# Patient Record
Sex: Female | Born: 1970 | Race: White | Hispanic: No | Smoking: Never smoker
Health system: Southern US, Community
[De-identification: ages and names within clinical notes are randomized; demographics above are authoritative.]

## PROBLEM LIST (undated history)

## (undated) DIAGNOSIS — K219 Gastro-esophageal reflux disease without esophagitis: Secondary | ICD-10-CM

## (undated) DIAGNOSIS — N200 Calculus of kidney: Secondary | ICD-10-CM

## (undated) DIAGNOSIS — N1330 Unspecified hydronephrosis: Secondary | ICD-10-CM

## (undated) HISTORY — PX: HERNIA REPAIR: SHX51

---

## 2014-04-21 ENCOUNTER — Encounter (HOSPITAL_COMMUNITY): Payer: Self-pay | Admitting: Emergency Medicine

## 2014-04-21 ENCOUNTER — Emergency Department (HOSPITAL_COMMUNITY)
Admission: EM | Admit: 2014-04-21 | Discharge: 2014-04-22 | Disposition: A | Discharge status: Home / Self Care | Payer: BC Managed Care – PPO | Attending: Emergency Medicine | Admitting: Emergency Medicine

## 2014-04-21 DIAGNOSIS — R109 Unspecified abdominal pain: Secondary | ICD-10-CM | POA: Insufficient documentation

## 2014-04-21 DIAGNOSIS — N200 Calculus of kidney: Secondary | ICD-10-CM | POA: Insufficient documentation

## 2014-04-21 DIAGNOSIS — Z3202 Encounter for pregnancy test, result negative: Secondary | ICD-10-CM | POA: Insufficient documentation

## 2014-04-21 DIAGNOSIS — K219 Gastro-esophageal reflux disease without esophagitis: Secondary | ICD-10-CM | POA: Insufficient documentation

## 2014-04-21 DIAGNOSIS — Z9889 Other specified postprocedural states: Secondary | ICD-10-CM | POA: Insufficient documentation

## 2014-04-21 DIAGNOSIS — N12 Tubulo-interstitial nephritis, not specified as acute or chronic: Secondary | ICD-10-CM | POA: Insufficient documentation

## 2014-04-21 DIAGNOSIS — Z79899 Other long term (current) drug therapy: Secondary | ICD-10-CM | POA: Insufficient documentation

## 2014-04-21 HISTORY — DX: Unspecified hydronephrosis: N13.30

## 2014-04-21 HISTORY — DX: Gastro-esophageal reflux disease without esophagitis: K21.9

## 2014-04-21 HISTORY — DX: Calculus of kidney: N20.0

## 2014-04-21 NOTE — ED Notes (Addendum)
Pt c/o L flank pain onset 2 hours ago, pt states she did have fever yesterday with dysuria. Pt states she began AZO yesterday. Pt states she has chronic hydronephrosis and kidney stones

## 2014-04-22 ENCOUNTER — Emergency Department (HOSPITAL_COMMUNITY): Payer: BC Managed Care – PPO

## 2014-04-22 LAB — BASIC METABOLIC PANEL
Anion gap: 12 (ref 5–15)
BUN: 21 mg/dL (ref 6–23)
CHLORIDE: 100 meq/L (ref 96–112)
CO2: 26 mEq/L (ref 19–32)
Calcium: 9.2 mg/dL (ref 8.4–10.5)
Creatinine, Ser: 1.18 mg/dL — ABNORMAL HIGH (ref 0.50–1.10)
GFR calc non Af Amer: 56 mL/min — ABNORMAL LOW (ref >90)
GFR, EST AFRICAN AMERICAN: 65 mL/min — AB (ref >90)
Glucose, Bld: 97 mg/dL (ref 70–99)
POTASSIUM: 3.9 meq/L (ref 3.7–5.3)
Sodium: 138 mEq/L (ref 137–147)

## 2014-04-22 LAB — CBC WITH DIFFERENTIAL/PLATELET
BASOS PCT: 0 % (ref 0–1)
Basophils Absolute: 0 10*3/uL (ref 0.0–0.1)
Eosinophils Absolute: 0.2 10*3/uL (ref 0.0–0.7)
Eosinophils Relative: 2 % (ref 0–5)
HCT: 38.3 % (ref 36.0–46.0)
HEMOGLOBIN: 13.2 g/dL (ref 12.0–15.0)
LYMPHS ABS: 3.1 10*3/uL (ref 0.7–4.0)
Lymphocytes Relative: 30 % (ref 12–46)
MCH: 31.7 pg (ref 26.0–34.0)
MCHC: 34.5 g/dL (ref 30.0–36.0)
MCV: 92.1 fL (ref 78.0–100.0)
Monocytes Absolute: 0.8 10*3/uL (ref 0.1–1.0)
Monocytes Relative: 8 % (ref 3–12)
NEUTROS ABS: 6 10*3/uL (ref 1.7–7.7)
NEUTROS PCT: 60 % (ref 43–77)
Platelets: 214 10*3/uL (ref 150–400)
RBC: 4.16 MIL/uL (ref 3.87–5.11)
RDW: 12.3 % (ref 11.5–15.5)
WBC: 10.1 10*3/uL (ref 4.0–10.5)

## 2014-04-22 LAB — URINALYSIS, ROUTINE W REFLEX MICROSCOPIC
Bilirubin Urine: NEGATIVE
Glucose, UA: NEGATIVE mg/dL
Ketones, ur: NEGATIVE mg/dL
Nitrite: POSITIVE — AB
Protein, ur: 30 mg/dL — AB
SPECIFIC GRAVITY, URINE: 1.012 (ref 1.005–1.030)
UROBILINOGEN UA: 1 mg/dL (ref 0.0–1.0)
pH: 6 (ref 5.0–8.0)

## 2014-04-22 LAB — URINE MICROSCOPIC-ADD ON

## 2014-04-22 LAB — POC URINE PREG, ED: PREG TEST UR: NEGATIVE

## 2014-04-22 MED ORDER — HYDROCODONE-ACETAMINOPHEN 5-325 MG PO TABS: 1.0000 | ORAL_TABLET | Freq: Four times a day (QID) | ORAL | Status: AC | PRN

## 2014-04-22 MED ORDER — DEXTROSE 5 % IV SOLN
1.0000 g | Freq: Once | INTRAVENOUS | Status: AC
  Administered 2014-04-22: 1 g via INTRAVENOUS
  Filled 2014-04-22: qty 10

## 2014-04-22 MED ORDER — CEPHALEXIN 500 MG PO CAPS: 500.0000 mg | ORAL_CAPSULE | Freq: Two times a day (BID) | ORAL | Status: DC

## 2014-04-22 MED ORDER — SODIUM CHLORIDE 0.9 % IV BOLUS (SEPSIS)
1000.0000 mL | Freq: Once | INTRAVENOUS | Status: AC
  Administered 2014-04-22: 1000 mL via INTRAVENOUS

## 2014-04-22 MED ORDER — KETOROLAC TROMETHAMINE 30 MG/ML IJ SOLN
30.0000 mg | Freq: Once | INTRAMUSCULAR | Status: AC
  Administered 2014-04-22: 30 mg via INTRAVENOUS
  Filled 2014-04-22: qty 1

## 2014-04-22 MED ORDER — ONDANSETRON 8 MG PO TBDP: 8.0000 mg | ORAL_TABLET | Freq: Three times a day (TID) | ORAL | Status: DC | PRN

## 2014-04-22 NOTE — Discharge Instructions (Signed)
You have a kidney infection. We also see a kidney stone on the left side with some hydronephrosis. Hard to tell if the kidney stone is causing any problems. Please see the Urologist as requested. Please return to the ER if your symptoms worsen; you have increased pain, fevers, chills, inability to keep any medications down, confusion. Otherwise see the outpatient doctor as requested.   Kidney Stones Kidney stones (urolithiasis) are deposits that form inside your kidneys. The intense pain is caused by the stone moving through the urinary tract. When the stone moves, the ureter goes into spasm around the stone. The stone is usually passed in the urine.  CAUSES   A disorder that makes certain neck glands produce too much parathyroid hormone (primary hyperparathyroidism).  A buildup of uric acid crystals, similar to gout in your joints.  Narrowing (stricture) of the ureter.  A kidney obstruction present at birth (congenital obstruction).  Previous surgery on the kidney or ureters.  Numerous kidney infections. SYMPTOMS   Feeling sick to your stomach (nauseous).  Throwing up (vomiting).  Blood in the urine (hematuria).  Pain that usually spreads (radiates) to the groin.  Frequency or urgency of urination. DIAGNOSIS   Taking a history and physical exam.  Blood or urine tests.  CT scan.  Occasionally, an examination of the inside of the urinary bladder (cystoscopy) is performed. TREATMENT   Observation.  Increasing your fluid intake.  Extracorporeal shock wave lithotripsy--This is a noninvasive procedure that uses shock waves to break up kidney stones.  Surgery may be needed if you have severe pain or persistent obstruction. There are various surgical procedures. Most of the procedures are performed with the use of small instruments. Only small incisions are needed to accommodate these instruments, so recovery time is minimized. The size, location, and chemical composition  are all important variables that will determine the proper choice of action for you. Talk to your health care provider to better understand your situation so that you will minimize the risk of injury to yourself and your kidney.  HOME CARE INSTRUCTIONS   Drink enough water and fluids to keep your urine clear or pale yellow. This will help you to pass the stone or stone fragments.  Strain all urine through the provided strainer. Keep all particulate matter and stones for your health care provider to see. The stone causing the pain may be as small as a grain of salt. It is very important to use the strainer each and every time you pass your urine. The collection of your stone will allow your health care provider to analyze it and verify that a stone has actually passed. The stone analysis will often identify what you can do to reduce the incidence of recurrences.  Only take over-the-counter or prescription medicines for pain, discomfort, or fever as directed by your health care provider.  Make a follow-up appointment with your health care provider as directed.  Get follow-up X-rays if required. The absence of pain does not always mean that the stone has passed. It may have only stopped moving. If the urine remains completely obstructed, it can cause loss of kidney function or even complete destruction of the kidney. It is your responsibility to make sure X-rays and follow-ups are completed. Ultrasounds of the kidney can show blockages and the status of the kidney. Ultrasounds are not associated with any radiation and can be performed easily in a matter of minutes. SEEK MEDICAL CARE IF:  You experience pain that is progressive and  unresponsive to any pain medicine you have been prescribed. SEEK IMMEDIATE MEDICAL CARE IF:   Pain cannot be controlled with the prescribed medicine.  You have a fever or shaking chills.  The severity or intensity of pain increases over 18 hours and is not relieved by  pain medicine.  You develop a new onset of abdominal pain.  You feel faint or pass out.  You are unable to urinate. MAKE SURE YOU:   Understand these instructions.  Will watch your condition.  Will get help right away if you are not doing well or get worse. Document Released: 09/14/2005 Document Revised: 05/17/2013 Document Reviewed: 02/15/2013 [facility] Patient Information 2015 [place], [place]. This information is not intended to replace advice given to you by your health care provider. Make sure you discuss any questions you have with your health care provider.  Pyelonephritis, Adult Pyelonephritis is a kidney infection. In general, there are 2 main types of pyelonephritis:  Infections that come on quickly without any warning (acute pyelonephritis).  Infections that persist for a long period of time (chronic pyelonephritis). CAUSES  Two main causes of pyelonephritis are:  Bacteria traveling from the bladder to the kidney. This is a problem especially in pregnant women. The urine in the bladder can become filled with bacteria from multiple causes, including:  Inflammation of the prostate gland (prostatitis).  Sexual intercourse in females.  Bladder infection (cystitis).  Bacteria traveling from the bloodstream to the tissue part of the kidney. Problems that may increase your risk of getting a kidney infection include:  Diabetes.  Kidney stones or bladder stones.  Cancer.  Catheters placed in the bladder.  Other abnormalities of the kidney or ureter. SYMPTOMS   Abdominal pain.  Pain in the side or flank area.  Fever.  Chills.  Upset stomach.  Blood in the urine (dark urine).  Frequent urination.  Strong or persistent urge to urinate.  Burning or stinging when urinating. DIAGNOSIS  Your caregiver may diagnose your kidney infection based on your symptoms. A urine sample may also be taken. TREATMENT  In general, treatment depends on how severe the  infection is.   If the infection is mild and caught early, your caregiver may treat you with oral antibiotics and send you home.  If the infection is more severe, the bacteria may have gotten into the bloodstream. This will require intravenous (IV) antibiotics and a hospital stay. Symptoms may include:  High fever.  Severe flank pain.  Shaking chills.  Even after a hospital stay, your caregiver may require you to be on oral antibiotics for a period of time.  Other treatments may be required depending upon the cause of the infection. HOME CARE INSTRUCTIONS   Take your antibiotics as directed. Finish them even if you start to feel better.  Make an appointment to have your urine checked to make sure the infection is gone.  Drink enough fluids to keep your urine clear or pale yellow.  Take medicines for the bladder if you have urgency and frequency of urination as directed by your caregiver. SEEK IMMEDIATE MEDICAL CARE IF:   You have a fever or persistent symptoms for more than 2-3 days.  You have a fever and your symptoms suddenly get worse.  You are unable to take your antibiotics or fluids.  You develop shaking chills.  You experience extreme weakness or fainting.  There is no improvement after 2 days of treatment. MAKE SURE YOU:  Understand these instructions.  Will watch your condition.  Will get help right away if you are not doing well or get worse. Document Released: 09/14/2005 Document Revised: 03/15/2012 Document Reviewed: 02/18/2011 [facility] Patient Information 2015 [place], [place]. This information is not intended to replace advice given to you by your health care provider. Make sure you discuss any questions you have with your health care provider.

## 2014-04-22 NOTE — ED Provider Notes (Signed)
CSN: [id number]     Arrival date & time 04/21/14  2241 History   First MD Initiated Contact with Patient 04/21/14 2308     Chief Complaint  Patient presents with  . Flank Pain     (Consider location/radiation/quality/duration/timing/severity/associated sxs/prior Treatment) HPI Comments: A 43 yo female with a PMH of kidney stones and hydronephrosis presents to the ED with complaints of left flank pain x 1 day.  She describes the pain as a DULL,  constant pain deep in her left side.  5/10 in intensity.  Denies radiation of the pain.  States she had a fever yesterday afternoon.  Today she began to have dysuria.  Denies urinary frequency or gross blood in the urine.  Patient began using OTC urinary tract infection treatment this afternoon.  No changes to bowel movements.  Patient states LMP was 04/15/14.  Denies nausea, vomiting, diarrhea.  Dr. Maxie Better from [place] is the pt's PCP.  Pt states she had routine imaging of her kidneys 7-8 months ago and everything appeared normal at this time.  Prior abdominal surgeries include an umbilical hernia repair 2 years ago as well as a Cesarean section 14 years ago.          Patient is a 43 y.o. female presenting with flank pain. The history is provided by the patient.  Flank Pain Pertinent negatives include no chest pain, no abdominal pain, no headaches and no shortness of breath.    Past Medical History  Diagnosis Date  . Kidney stones   . Hydronephrosis   . GERD (gastroesophageal reflux disease)    Past Surgical History  Procedure Laterality Date  . Hernia repair    . Cesarean section     No family history on file. History  Substance Use Topics  . Smoking status: Never Smoker   . Smokeless tobacco: Not on file  . Alcohol Use: Yes     Comment: rare   OB History   Grav Para Term Preterm Abortions TAB SAB Ect Mult Living                 Review of Systems  Constitutional: Positive for fever and activity change.  Respiratory: Negative for  shortness of breath.   Cardiovascular: Negative for chest pain.  Gastrointestinal: Positive for nausea. Negative for vomiting and abdominal pain.  Genitourinary: Positive for flank pain. Negative for dysuria.  Musculoskeletal: Negative for neck pain.  Neurological: Negative for headaches.      Allergies  Review of patient's allergies indicates no known allergies.  Home Medications   Prior to Admission medications   Medication Sig Start Date End Date Taking? Authorizing Provider  loratadine (CLARITIN) 10 MG tablet Take 10 mg by mouth at bedtime as needed for allergies.   Yes Historical Provider, MD  naphazoline-pheniramine (NAPHCON-A) 0.025-0.3 % ophthalmic solution Place 2 drops into both eyes 4 (four) times daily as needed for irritation.   Yes Historical Provider, MD  omeprazole (PRILOSEC) 20 MG capsule Take 1 capsule by mouth at bedtime as needed (acid reflux).  02/11/14  Yes Historical Provider, MD  cephALEXin (KEFLEX) 500 MG capsule Take 1 capsule (500 mg total) by mouth 2 (two) times daily. 04/22/14   Varney Biles, MD  HYDROcodone-acetaminophen (NORCO/VICODIN) 5-325 MG per tablet Take 1 tablet by mouth every 6 (six) hours as needed. 04/22/14   Varney Biles, MD  ondansetron (ZOFRAN ODT) 8 MG disintegrating tablet Take 1 tablet (8 mg total) by mouth every 8 (eight) hours as needed for nausea.  04/22/14   Athan Casalino Kathrynn Humble, MD   BP 129/81  Pulse 65  Temp(Src) 98.1 F (36.7 C) (Oral)  Resp 18  Wt 150 lb (68.04 kg)  SpO2 96%  LMP 04/12/2014 Physical Exam  Nursing note and vitals reviewed. Constitutional: She is oriented to person, place, and time. She appears well-developed and well-nourished.  HENT:  Head: Normocephalic and atraumatic.  Eyes: EOM are normal. Pupils are equal, round, and reactive to light.  Neck: Neck supple.  Cardiovascular: Normal rate, regular rhythm and normal heart sounds.   No murmur heard. Pulmonary/Chest: Effort normal. No respiratory distress.   Abdominal: Soft. She exhibits no distension. There is no tenderness. There is no rebound and no guarding.  Neurological: She is alert and oriented to person, place, and time.  Skin: Skin is warm and dry.    ED Course  Procedures (including critical care time) Labs Review Labs Reviewed  BASIC METABOLIC PANEL - Abnormal; Notable for the following:    Creatinine, Ser 1.18 (*)    GFR calc non Af Amer 56 (*)    GFR calc Af Amer 65 (*)    All other components within normal limits  URINALYSIS, ROUTINE W REFLEX MICROSCOPIC - Abnormal; Notable for the following:    Color, Urine ORANGE (*)    Hgb urine dipstick MODERATE (*)    Protein, ur 30 (*)    Nitrite POSITIVE (*)    Leukocytes, UA SMALL (*)    All other components within normal limits  URINE MICROSCOPIC-ADD ON - Abnormal; Notable for the following:    Bacteria, UA MANY (*)    All other components within normal limits  URINE CULTURE  CBC WITH DIFFERENTIAL  POC URINE PREG, ED    Imaging Review US Renal  04/22/2014   CLINICAL DATA:  Left flank pain.  EXAM: RENAL/URINARY TRACT ULTRASOUND COMPLETE  COMPARISON:  None.  FINDINGS: Right Kidney:  Length: 11.0 cm. Echogenicity within normal limits. No mass or hydronephrosis visualized.  Left Kidney:  Length: 11.6 cm. Echogenicity within normal limits. Moderate hydronephrosis present. 8 mm echogenic focus within knee left renal pelvis/proximal left ureter is suggestive of a stone. 1.5 x 1.4 x 1.6 cm cystic lesion with possible internal debris present within the upper pole of the left kidney. There is possible associated mural calcification. No internal vascularity. No other focal lesions.  Bladder:  Appears normal for degree of bladder distention.  IMPRESSION: 1. Moderate left hydronephrosis with 8 mm calculus within the left renal pelvis/proximal left ureter. 2. 1.5 x 1.4 x 1.6 cm complex cystic lesion within the upper pole of the left kidney with internal debris. Finding is indeterminate.  Comparison with prior studies to assess the stability of this lesion is recommended. 3. Normal ultrasound of the right kidney.   Electronically Signed   By: Jeannine Boga M.D.   On: 04/22/2014 02:07     EKG Interpretation None      MDM   Final diagnoses:  Pyelonephritis  Nephrolithiasis    Pt comes in with cc of abd pain. Pt has hx of hydronephrosis. The UA shows infection and she has dysuria. Pain is constant, dull - atypical of renal stones, but the [place] does appear to show stone. Pt 's pain is tolerable, she is not septic and she is tolerating po well - so we will d/c her with keflex and Urology f/u for the hydronephrosis and possible stone. Return precautions have been discussed.  Varney Biles, MD 04/23/14 [phone]

## 2014-04-24 LAB — URINE CULTURE
Colony Count: >=100000
Special Requests: NORMAL

## 2014-04-27 ENCOUNTER — Telehealth (HOSPITAL_BASED_OUTPATIENT_CLINIC_OR_DEPARTMENT_OTHER): Payer: Self-pay | Admitting: Emergency Medicine

## 2014-04-27 NOTE — Telephone Encounter (Signed)
Post ED Visit - Positive Culture Follow-up  Culture report reviewed by antimicrobial stewardship pharmacist: []  Wes [place], Pharm.D., BCPS [x]  Heide Guile, Pharm.D., BCPS []  Alycia Rossetti, Pharm.D., BCPS []  [place], Pharm.D., BCPS, AAHIVP []  Legrand Como, Pharm.D., BCPS, AAHIVP  Positive urine culture Treated with Keflex, organism sensitive to the same and no further patient follow-up is required at this time.  Byars, Rex Kras 04/27/2014, 11:08 AM

## 2014-06-27 ENCOUNTER — Emergency Department (HOSPITAL_BASED_OUTPATIENT_CLINIC_OR_DEPARTMENT_OTHER): Payer: BC Managed Care – PPO

## 2014-06-27 ENCOUNTER — Encounter (HOSPITAL_BASED_OUTPATIENT_CLINIC_OR_DEPARTMENT_OTHER): Payer: Self-pay | Admitting: Emergency Medicine

## 2014-06-27 ENCOUNTER — Inpatient Hospital Stay (HOSPITAL_BASED_OUTPATIENT_CLINIC_OR_DEPARTMENT_OTHER)
Admission: EM | Admit: 2014-06-27 | Discharge: 2014-06-29 | DRG: 872 | Disposition: A | Discharge status: Home / Self Care | Payer: BC Managed Care – PPO | Attending: Internal Medicine | Admitting: Internal Medicine

## 2014-06-27 DIAGNOSIS — N12 Tubulo-interstitial nephritis, not specified as acute or chronic: Secondary | ICD-10-CM | POA: Diagnosis present

## 2014-06-27 DIAGNOSIS — E876 Hypokalemia: Secondary | ICD-10-CM

## 2014-06-27 DIAGNOSIS — N390 Urinary tract infection, site not specified: Secondary | ICD-10-CM | POA: Diagnosis present

## 2014-06-27 DIAGNOSIS — D72829 Elevated white blood cell count, unspecified: Secondary | ICD-10-CM

## 2014-06-27 DIAGNOSIS — Z87442 Personal history of urinary calculi: Secondary | ICD-10-CM

## 2014-06-27 DIAGNOSIS — R509 Fever, unspecified: Secondary | ICD-10-CM | POA: Diagnosis present

## 2014-06-27 DIAGNOSIS — N1330 Unspecified hydronephrosis: Secondary | ICD-10-CM | POA: Diagnosis present

## 2014-06-27 DIAGNOSIS — A4151 Sepsis due to Escherichia coli [E. coli]: Secondary | ICD-10-CM | POA: Diagnosis present

## 2014-06-27 DIAGNOSIS — K219 Gastro-esophageal reflux disease without esophagitis: Secondary | ICD-10-CM | POA: Diagnosis present

## 2014-06-27 DIAGNOSIS — I959 Hypotension, unspecified: Secondary | ICD-10-CM | POA: Diagnosis present

## 2014-06-27 LAB — CBC WITH DIFFERENTIAL/PLATELET
BASOS ABS: 0 10*3/uL (ref 0.0–0.1)
BASOS PCT: 0 % (ref 0–1)
EOS ABS: 0 10*3/uL (ref 0.0–0.7)
Eosinophils Relative: 0 % (ref 0–5)
HCT: 38 % (ref 36.0–46.0)
Hemoglobin: 13.2 g/dL (ref 12.0–15.0)
Lymphocytes Relative: 11 % — ABNORMAL LOW (ref 12–46)
Lymphs Abs: 1.3 10*3/uL (ref 0.7–4.0)
MCH: 32.2 pg (ref 26.0–34.0)
MCHC: 34.7 g/dL (ref 30.0–36.0)
MCV: 92.7 fL (ref 78.0–100.0)
Monocytes Absolute: 1.2 10*3/uL — ABNORMAL HIGH (ref 0.1–1.0)
Monocytes Relative: 11 % (ref 3–12)
NEUTROS ABS: 8.8 10*3/uL — AB (ref 1.7–7.7)
NEUTROS PCT: 78 % — AB (ref 43–77)
Platelets: 166 10*3/uL (ref 150–400)
RBC: 4.1 MIL/uL (ref 3.87–5.11)
RDW: 11.9 % (ref 11.5–15.5)
WBC: 11.3 10*3/uL — ABNORMAL HIGH (ref 4.0–10.5)

## 2014-06-27 LAB — URINALYSIS, ROUTINE W REFLEX MICROSCOPIC
Bilirubin Urine: NEGATIVE
Glucose, UA: NEGATIVE mg/dL
Nitrite: POSITIVE — AB
PROTEIN: NEGATIVE mg/dL
Specific Gravity, Urine: 1.013 (ref 1.005–1.030)
UROBILINOGEN UA: 0.2 mg/dL (ref 0.0–1.0)
pH: 6 (ref 5.0–8.0)

## 2014-06-27 LAB — COMPREHENSIVE METABOLIC PANEL
ALT: 11 U/L (ref 0–35)
AST: 18 U/L (ref 0–37)
Albumin: 3.7 g/dL (ref 3.5–5.2)
Alkaline Phosphatase: 55 U/L (ref 39–117)
Anion gap: 16 — ABNORMAL HIGH (ref 5–15)
BILIRUBIN TOTAL: 0.8 mg/dL (ref 0.3–1.2)
BUN: 10 mg/dL (ref 6–23)
CHLORIDE: 95 meq/L — AB (ref 96–112)
CO2: 23 mEq/L (ref 19–32)
Calcium: 9.2 mg/dL (ref 8.4–10.5)
Creatinine, Ser: 1.1 mg/dL (ref 0.50–1.10)
GFR calc Af Amer: 71 mL/min — ABNORMAL LOW (ref >90)
GFR calc non Af Amer: 61 mL/min — ABNORMAL LOW (ref >90)
Glucose, Bld: 114 mg/dL — ABNORMAL HIGH (ref 70–99)
POTASSIUM: 3.5 meq/L — AB (ref 3.7–5.3)
Sodium: 134 mEq/L — ABNORMAL LOW (ref 137–147)
TOTAL PROTEIN: 7.6 g/dL (ref 6.0–8.3)

## 2014-06-27 LAB — PREGNANCY, URINE: PREG TEST UR: NEGATIVE

## 2014-06-27 LAB — URINE MICROSCOPIC-ADD ON

## 2014-06-27 LAB — LIPASE, BLOOD: Lipase: 19 U/L (ref 11–59)

## 2014-06-27 LAB — I-STAT CG4 LACTIC ACID, ED: Lactic Acid, Venous: 1.96 mmol/L (ref 0.5–2.2)

## 2014-06-27 MED ORDER — SODIUM CHLORIDE 0.9 % IV BOLUS (SEPSIS)
1000.0000 mL | Freq: Once | INTRAVENOUS | Status: AC
  Administered 2014-06-27: 1000 mL via INTRAVENOUS

## 2014-06-27 MED ORDER — CEFTRIAXONE SODIUM 1 G IJ SOLR
INTRAMUSCULAR | Status: AC
  Filled 2014-06-27: qty 10

## 2014-06-27 MED ORDER — DEXTROSE 5 % IV SOLN
1.0000 g | Freq: Once | INTRAVENOUS | Status: AC
  Administered 2014-06-27: 1 g via INTRAVENOUS

## 2014-06-27 MED ORDER — ACETAMINOPHEN 325 MG PO TABS
650.0000 mg | ORAL_TABLET | Freq: Once | ORAL | Status: AC
  Administered 2014-06-27: 650 mg via ORAL
  Filled 2014-06-27: qty 2

## 2014-06-27 MED ORDER — SODIUM CHLORIDE 0.9 % IV SOLN
INTRAVENOUS | Status: AC
  Administered 2014-06-28: via INTRAVENOUS

## 2014-06-27 NOTE — ED Notes (Signed)
Pt c/o fever x 1 day with Chronic flank pain

## 2014-06-27 NOTE — ED Provider Notes (Addendum)
CSN: [id number]     Arrival date & time 06/27/14  2012 History   This chart was scribed for Orpah Greek, by Tula Nakayama, ED Scribe. This patient was seen in room MH03/MH03 and the patient's care was started at 8:28 PM.   Chief Complaint  Patient presents with  . Fever   The history is provided by the patient. No language interpreter was used.    HPI Comments: Diane Holt is a 43 y.o. female with a history of kidney stones and hydronephrosis, who presents to the Emergency Department complaining of 5/10, constant, achy left flank pain and associated gradually worsening fever that started this morning. She states she has nausea as an associated symptom. Pt had a fever of 98 this morning that rose to 103 by this afternoon. Pt states that she has had similar symptoms in the past and that her current symptoms as less severe. Pt currently has 4 kidney stones and has a history of ureter problems. She has history of arthritis in her back. Pt took OTC Migraine and hydrocodone 12 hours ago with no relief to her symptoms.   Past Medical History  Diagnosis Date  . Kidney stones   . Hydronephrosis   . GERD (gastroesophageal reflux disease)    Past Surgical History  Procedure Laterality Date  . Hernia repair    . Cesarean section     No family history on file. History  Substance Use Topics  . Smoking status: Never Smoker   . Smokeless tobacco: Not on file  . Alcohol Use: Yes     Comment: rare   OB History   Grav Para Term Preterm Abortions TAB SAB Ect Mult Living                 Review of Systems  Constitutional: Positive for fever.  Gastrointestinal: Positive for nausea. Negative for vomiting.  Genitourinary: Positive for flank pain.  Musculoskeletal: Positive for back pain.  All other systems reviewed and are negative.     Allergies  Review of patient's allergies indicates no known allergies.  Home Medications   Prior to Admission medications   Medication  Sig Start Date End Date Taking? Authorizing Provider  HYDROcodone-acetaminophen (NORCO/VICODIN) 5-325 MG per tablet Take 1 tablet by mouth every 6 (six) hours as needed. 04/22/14   Varney Biles, MD  loratadine (CLARITIN) 10 MG tablet Take 10 mg by mouth at bedtime as needed for allergies.    Historical Provider, MD  naphazoline-pheniramine (NAPHCON-A) 0.025-0.3 % ophthalmic solution Place 2 drops into both eyes 4 (four) times daily as needed for irritation.    Historical Provider, MD  omeprazole (PRILOSEC) 20 MG capsule Take 1 capsule by mouth at bedtime as needed (acid reflux).  02/11/14   Historical Provider, MD  ondansetron (ZOFRAN ODT) 8 MG disintegrating tablet Take 1 tablet (8 mg total) by mouth every 8 (eight) hours as needed for nausea. 04/22/14   Ankit Kathrynn Humble, MD   BP 109/65  Pulse 102  Temp(Src) 99.9 F (37.7 C) (Oral)  Resp 16  Ht 5\' 5"  (1.651 m)  Wt 155 lb (70.308 kg)  BMI 25.79 kg/m2  SpO2 98%  LMP 06/20/2014 Physical Exam  Nursing note and vitals reviewed. Constitutional: She is oriented to person, place, and time. She appears well-developed and well-nourished. No distress.  HENT:  Head: Normocephalic and atraumatic.  Right Ear: Hearing normal.  Left Ear: Hearing normal.  Nose: Nose normal.  Mouth/Throat: Oropharynx is clear and moist and mucous membranes are  normal.  Eyes: Conjunctivae and EOM are normal. Pupils are equal, round, and reactive to light.  Neck: Normal range of motion. Neck supple.  Cardiovascular: Regular rhythm, S1 normal and S2 normal.  Exam reveals no gallop and no friction rub.   No murmur heard. Pulmonary/Chest: Effort normal and breath sounds normal. No respiratory distress. She exhibits no tenderness.  Abdominal: Soft. Normal appearance and bowel sounds are normal. There is no hepatosplenomegaly. There is no tenderness. There is no rebound, no guarding, no tenderness at McBurney's point and negative Murphy's sign. No hernia.  Musculoskeletal:  Normal range of motion.  Neurological: She is alert and oriented to person, place, and time. She has normal strength. No cranial nerve deficit or sensory deficit. Coordination normal. GCS eye subscore is 4. GCS verbal subscore is 5. GCS motor subscore is 6.  Skin: Skin is warm, dry and intact. No rash noted. No cyanosis.  Psychiatric: She has a normal mood and affect. Her speech is normal and behavior is normal. Thought content normal.    ED Course  Procedures (including critical care time) DIAGNOSTIC STUDIES: Oxygen Saturation is 100% on RA, normal by my interpretation.    COORDINATION OF CARE: 8:34 PM Discussed treatment plan with pt at bedside and pt agreed to plan.   Labs Review Labs Reviewed  URINALYSIS, ROUTINE W REFLEX MICROSCOPIC - Abnormal; Notable for the following:    Hgb urine dipstick MODERATE (*)    Ketones, ur >80 (*)    Nitrite POSITIVE (*)    Leukocytes, UA MODERATE (*)    All other components within normal limits  CBC WITH DIFFERENTIAL - Abnormal; Notable for the following:    WBC 11.3 (*)    Neutrophils Relative % 78 (*)    Neutro Abs 8.8 (*)    Lymphocytes Relative 11 (*)    Monocytes Absolute 1.2 (*)    All other components within normal limits  COMPREHENSIVE METABOLIC PANEL - Abnormal; Notable for the following:    Sodium 134 (*)    Potassium 3.5 (*)    Chloride 95 (*)    Glucose, Bld 114 (*)    GFR calc non Af Amer 61 (*)    GFR calc Af Amer 71 (*)    Anion gap 16 (*)    All other components within normal limits  URINE MICROSCOPIC-ADD ON - Abnormal; Notable for the following:    Squamous Epithelial / LPF FEW (*)    Bacteria, UA MANY (*)    All other components within normal limits  URINE CULTURE  CULTURE, BLOOD (ROUTINE X 2)  CULTURE, BLOOD (ROUTINE X 2)  PREGNANCY, URINE  LIPASE, BLOOD  I-STAT CG4 LACTIC ACID, ED    Imaging Review Dg Chest 2 View  06/27/2014   CLINICAL DATA:  Fever for 1 day.  Low back pain.  EXAM: CHEST  2 VIEW   COMPARISON:  None.  FINDINGS: The heart size and mediastinal contours are within normal limits. Both lungs are clear. The visualized skeletal structures are unremarkable.  IMPRESSION: No active cardiopulmonary disease.   Electronically Signed   By: Lucienne Capers M.D.   On: 06/27/2014 21:29   Ct Renal Stone Study  06/27/2014   CLINICAL DATA:  Left flank pain associated with nausea fever ; history of urinary tract stones  EXAM: CT ABDOMEN AND PELVIS WITHOUT CONTRAST  TECHNIQUE: Multidetector CT imaging of the abdomen and pelvis was performed following the standard protocol without IV contrast.  COMPARISON:  Supine abdominal film of May 18, 2014 and CT scan of the abdomen and pelvis of May 03, 2014  FINDINGS: Again demonstrated is moderate left-sided hydronephrosis apparently secondary to ureteropelvic junction obstruction. There are 2 nonobstructing mid to lower polestones on the left with the largest measuring 4 mm in diameter. There is a stable 1.5 cm diameter midpole cyst. On the right there are nonobstructing stones measuring up to 2 mm in diameter. No ureteral or bladder stones are demonstrated. The perinephric fat is normal in density.  The liver, gallbladder, spleen, pancreas, adrenal glands, and abdominal aorta are unremarkable. There is a punctate calcification in the right hepatic lobe. The stomach, small bowel, and large bowel are normal. There is a normal appendix. There is an IUD present. There is no adnexal mass nor significant free pelvic fluid.  The lumbar spine and bony pelvis exhibit no acute abnormalities. The lung bases are clear.  IMPRESSION: 1. 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 No obstructing stone is demonstrated here. There are nonobstructing stones in both kidneys. 2. No acute intra-abdominal or pelvic abnormality is demonstrated elsewhere.   Electronically Signed   By: David  Martinique   On: 06/27/2014 22:28      EKG Interpretation None      MDM   Final diagnoses:  None   UTI  Left hydronephrosis  Left ureteropelvic junction obstruction  Patient presents to the ER for evaluation of fever. Patient reports that she was not feeling well earlier, noticed that she had a fever of 102 earlier in the day. She has had generalized malaise and body aches. Patient has a history of chronic flank pain secondary to left-sided hydronephrosis. She thinks that this pain is worse, but not significantly worse.  She was febrile, with a 103.2 at arrival to the ER. Just a mild tachycardia associated with it. Has a slight leukocytosis. Renal function is normal. Electrolytes are normal. Urinalysis suggests infection.  Because the patient has a history of kidney stones as well as chronic hydronephrosis, CT scan was performed to rule out an obstructing stone in the setting of urinary infection. CT scan did not show any ureterolithiasis, persistent moderate left sided hydronephrosis was present.  Patient was initiated on Rocephin. She has, however, began to become hypotensive. Lowest blood pressure was 89 systolic. IV fluids administered. Lactate has been sent. 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 Patient is at risk for sepsis.  This was discussed with Doctor Arby Barrette, on call for urology. Specifically, he has indicated that there is no reason for intervention such as stenting. Patient is only partially obstructed and therefore the kidney is draining. He tells me that the treatment would be with IV antibiotics, similar to any routine pyelonephritis.  I personally performed the services described in this documentation, which was scribed in my presence. The recorded information has been reviewed and is accurate.     Orpah Greek,  MD 06/27/14 5701  Orpah Greek, MD 06/27/14 [phone]

## 2014-06-27 NOTE — ED Notes (Signed)
Patient transported to X-ray 

## 2014-06-27 NOTE — ED Notes (Signed)
MD at bedside. 

## 2014-06-27 NOTE — ED Notes (Signed)
Patient transported to CT 

## 2014-06-28 ENCOUNTER — Encounter (HOSPITAL_COMMUNITY): Payer: Self-pay | Admitting: Neurology

## 2014-06-28 DIAGNOSIS — N12 Tubulo-interstitial nephritis, not specified as acute or chronic: Secondary | ICD-10-CM

## 2014-06-28 LAB — BASIC METABOLIC PANEL
ANION GAP: 13 (ref 5–15)
BUN: 8 mg/dL (ref 6–23)
CO2: 25 mEq/L (ref 19–32)
CREATININE: 1 mg/dL (ref 0.50–1.10)
Calcium: 8.6 mg/dL (ref 8.4–10.5)
Chloride: 103 mEq/L (ref 96–112)
GFR calc Af Amer: 79 mL/min — ABNORMAL LOW (ref >90)
GFR calc non Af Amer: 68 mL/min — ABNORMAL LOW (ref >90)
GLUCOSE: 105 mg/dL — AB (ref 70–99)
Potassium: 4.1 mEq/L (ref 3.7–5.3)
Sodium: 141 mEq/L (ref 137–147)

## 2014-06-28 LAB — DIFFERENTIAL
BASOS ABS: 0 10*3/uL (ref 0.0–0.1)
BASOS PCT: 0 % (ref 0–1)
EOS PCT: 0 % (ref 0–5)
Eosinophils Absolute: 0 10*3/uL (ref 0.0–0.7)
Lymphocytes Relative: 20 % (ref 12–46)
Lymphs Abs: 2.2 10*3/uL (ref 0.7–4.0)
MONO ABS: 1.5 10*3/uL — AB (ref 0.1–1.0)
Monocytes Relative: 14 % — ABNORMAL HIGH (ref 3–12)
NEUTROS ABS: 7.2 10*3/uL (ref 1.7–7.7)
Neutrophils Relative %: 66 % (ref 43–77)

## 2014-06-28 LAB — HEPATIC FUNCTION PANEL
ALT: 10 U/L (ref 0–35)
AST: 16 U/L (ref 0–37)
Albumin: 3.4 g/dL — ABNORMAL LOW (ref 3.5–5.2)
Alkaline Phosphatase: 50 U/L (ref 39–117)
BILIRUBIN TOTAL: 0.5 mg/dL (ref 0.3–1.2)
Total Protein: 7 g/dL (ref 6.0–8.3)

## 2014-06-28 LAB — CBC
HCT: 37.7 % (ref 36.0–46.0)
Hemoglobin: 12.8 g/dL (ref 12.0–15.0)
MCH: 31.8 pg (ref 26.0–34.0)
MCHC: 34 g/dL (ref 30.0–36.0)
MCV: 93.5 fL (ref 78.0–100.0)
PLATELETS: 166 10*3/uL (ref 150–400)
RBC: 4.03 MIL/uL (ref 3.87–5.11)
RDW: 12.2 % (ref 11.5–15.5)
WBC: 10.9 10*3/uL — ABNORMAL HIGH (ref 4.0–10.5)

## 2014-06-28 LAB — MAGNESIUM: Magnesium: 1.8 mg/dL (ref 1.5–2.5)

## 2014-06-28 MED ORDER — LORATADINE 10 MG PO TABS
10.0000 mg | ORAL_TABLET | Freq: Every evening | ORAL | Status: DC | PRN
  Filled 2014-06-28: qty 1

## 2014-06-28 MED ORDER — ACETAMINOPHEN 325 MG PO TABS
650.0000 mg | ORAL_TABLET | Freq: Four times a day (QID) | ORAL | Status: DC | PRN
  Administered 2014-06-28: 650 mg via ORAL
  Filled 2014-06-28: qty 2

## 2014-06-28 MED ORDER — SODIUM CHLORIDE 0.9 % IV SOLN
INTRAVENOUS | Status: DC
  Administered 2014-06-28 – 2014-06-29 (×3): via INTRAVENOUS

## 2014-06-28 MED ORDER — HEPARIN SODIUM (PORCINE) 5000 UNIT/ML IJ SOLN
5000.0000 [IU] | Freq: Three times a day (TID) | INTRAMUSCULAR | Status: DC
  Administered 2014-06-28: 5000 [IU] via SUBCUTANEOUS
  Filled 2014-06-28 (×7): qty 1

## 2014-06-28 MED ORDER — INFLUENZA VAC SPLIT QUAD 0.5 ML IM SUSY
0.5000 mL | PREFILLED_SYRINGE | INTRAMUSCULAR | Status: DC
  Filled 2014-06-28 (×2): qty 0.5

## 2014-06-28 MED ORDER — NAPHAZOLINE-PHENIRAMINE 0.025-0.3 % OP SOLN
2.0000 [drp] | Freq: Four times a day (QID) | OPHTHALMIC | Status: DC | PRN
  Filled 2014-06-28: qty 15

## 2014-06-28 MED ORDER — HYDROCODONE-ACETAMINOPHEN 5-325 MG PO TABS
1.0000 | ORAL_TABLET | Freq: Four times a day (QID) | ORAL | Status: DC | PRN
  Administered 2014-06-29: 1 via ORAL
  Filled 2014-06-28: qty 1

## 2014-06-28 MED ORDER — PANTOPRAZOLE SODIUM 40 MG PO TBEC
40.0000 mg | DELAYED_RELEASE_TABLET | Freq: Every day | ORAL | Status: DC
  Administered 2014-06-28 – 2014-06-29 (×2): 40 mg via ORAL
  Filled 2014-06-28 (×2): qty 1

## 2014-06-28 MED ORDER — FLUCONAZOLE 100 MG PO TABS
100.0000 mg | ORAL_TABLET | Freq: Every day | ORAL | Status: DC
  Administered 2014-06-28 – 2014-06-29 (×2): 100 mg via ORAL
  Filled 2014-06-28 (×2): qty 1

## 2014-06-28 MED ORDER — DEXTROSE 5 % IV SOLN: 1.0000 g | INTRAVENOUS | Status: DC

## 2014-06-28 MED ORDER — ONDANSETRON 8 MG PO TBDP: 8.0000 mg | ORAL_TABLET | Freq: Three times a day (TID) | ORAL | Status: DC | PRN

## 2014-06-28 MED ORDER — DEXTROSE 5 % IV SOLN
1.0000 g | INTRAVENOUS | Status: DC
  Administered 2014-06-28: 1 g via INTRAVENOUS
  Filled 2014-06-28: qty 10

## 2014-06-28 NOTE — Progress Notes (Signed)
Patient ID: Diane Holt, female   DOB: 05-Feb-1971, 43 y.o.   MRN: [id number] [facility] PROGRESS NOTE  Diane Holt XBJ:[id number] DOB: 08-Oct-1970 DOA: 06/27/2014 PCP: No primary provider on file.  Brief narrative: 43 y.o. female history of kidney stones and chronic hydronephrosis of the left kidney who presented to [facility] 06/27/2014 with left flank pain and fevers of up to 103 F prior to this admission. Hydronephrosis is followed by urology, however given that her renal function is normal and hydronephrosis is unchanging they have not intervened as it is likely that her kidney is still draining some urine. Pt was slightly hypotensive on admission but her BP responded to IV fluids. UA revealed many bacteria and pt was started on rocephin.   Assessment/Plan:    Principal Problem: Acute pyelonephritis  UA on admission showed moderate leukocytes and many bacteria. Patient was started on rocephin 1 gm IV daily.  Follow up urine culture results.  Continue IV fluids.  Pain management efforts with norco PO Q 6 hours PRN moderate pain. Active Problems: Sepsis secondary to UTI / pyelonephritis.   Sepsis criteria met with initial vitals that included fever, tachycardia, hypotension and blood work significant for leukocytosis of 11.3. In addition, patient has evidence of infection based on UA which revealed moderate leukocytes and many bacteria.   Patient started on Rocephin IV 1 gm daily.  Follow up urine culture results.  Moderate left sided hydronephrosis  Based on CT scan persistent moderate left-sided hydronephrosis seen apparently due to ureteropelvic junction obstruction but no obstructing stone demonstrated.  Urology was consulted by EDP but at this point they do not feel that intervention is nessacary as patients hydronephrosis is chronic and likely kidney is still draining.   DVT Prophylaxis   Heparin subQ ordered.  Code Status: Full.  Family Communication:  plan of  care discussed with the patient Disposition Plan: Home when stable.    IV Access:   Peripheral IV Procedures and diagnostic studies:    Dg Chest 2 View 06/27/2014   No active cardiopulmonary disease.   Electronically Signed   By: Lucienne Capers M.D.   On: 06/27/2014 21:29    Ct Renal Stone Study 06/27/2014  1. 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 No obstructing stone is demonstrated here. There are nonobstructing stones in both kidneys. 2. No acute intra-abdominal or pelvic abnormality is demonstrated elsewhere.     Medical Consultants:   None  Other Consultants:   None  Anti-Infectives:   Rocephin 06/27/2014 -->   Leisa Lenz, MD  [facility] Pager [phone]  If 7PM-7AM, please contact night-coverage [email or web address] Password [email or web address] 06/28/2014, 6:11 AM   LOS: 1 day    HPI/Subjective: No acute overnight events.  Objective: Filed Vitals:   06/28/14 0000 06/28/14 0021 06/28/14 0103 06/28/14 0445  BP: 105/65 96/63 111/60 97/53  Pulse: 88 91 94 89  Temp:  98.2 F (36.8 C) 97.7 F (36.5 C) 99.1 F (37.3 C)  TempSrc:  Oral Oral Oral  Resp:  16 16 16   Height:   5' 5"  (1.651 m)   Weight:      SpO2: 99% 98% 99% 98%    Intake/Output Summary (Last 24 hours) at 06/28/14 4696 Last data filed at 06/28/14 0356  Gross per 24 hour  Intake  147.5 ml  Output    400 ml  Net -252.5 ml    Exam:   General:  Pt is alert, follows commands appropriately, not in acute distress  Cardiovascular: Regular rate and rhythm, S1/S2, no murmurs  Respiratory: Clear to auscultation bilaterally, no wheezing, no crackles, no rhonchi  Abdomen: Soft, non tender, non distended, bowel sounds present  Extremities: No edema, pulses DP and PT palpable bilaterally  Neuro: Grossly nonfocal  Data Reviewed: Basic Metabolic Panel:  Recent Labs Lab 06/27/14 2040 06/28/14 0413  NA 134* 141  K 3.5* 4.1  CL 95* 103   CO2 23 25  GLUCOSE 114* 105*  BUN 10 8  CREATININE 1.10 1.00  CALCIUM 9.2 8.6  MG  --  1.8   Liver Function Tests:  Recent Labs Lab 06/27/14 2040 06/28/14 0413  AST 18 16  ALT 11 10  ALKPHOS 55 50  BILITOT 0.8 0.5  PROT 7.6 7.0  ALBUMIN 3.7 3.4*    Recent Labs Lab 06/27/14 2040  LIPASE 19   No results found for this basename: AMMONIA,  in the last 168 hours CBC:  Recent Labs Lab 06/27/14 2040 06/28/14 0413  WBC 11.3* 10.9*  NEUTROABS 8.8* 7.2  HGB 13.2 12.8  HCT 38.0 37.7  MCV 92.7 93.5  PLT 166 166   Cardiac Enzymes: No results found for this basename: CKTOTAL, CKMB, CKMBINDEX, TROPONINI,  in the last 168 hours BNP: No components found with this basename: POCBNP,  CBG: No results found for this basename: GLUCAP,  in the last 168 hours  No results found for this or any previous visit (from the past 240 hour(s)).   Scheduled Meds: . cefTRIAXone (ROCEPHIN)  IV  1 g Intravenous Q24H  . heparin  5,000 Units Subcutaneous 3 times per day  . pantoprazole  40 mg Oral Daily   Continuous Infusions: . sodium chloride 125 mL/hr at 06/28/14 0119

## 2014-06-28 NOTE — H&P (Signed)
[facility] History and Physical  [name] VOZ:[id number] DOB: [birth year] DOA: 06/27/2014  Referring physician: EDP PCP: No primary provider on file.   Chief Complaint: Fever   HPI: Diane Holt is a 43 y.o. female history of kidney stones and chronic hydronephrosis of the left kidney.  Patient had fever of 103 this afternoon (103.2 in ED) as well as 5/10 achy left flank pain.  She has had chronic left flank pain due to the hydronephrosis.  Hydronephrosis is followed by urology, however given that her renal function is normal and hydronephrosis is unchanging they have not intervened as it is likely that her kidney is still draining some urine.  While in the ED her pressures briefly dropped to SBP in the 90s, this responded with IVF.  She was transferred to [facility] for IV abx given the transient hypotension.  Review of Systems: Systems reviewed.  As above, otherwise negative  Past Medical History  Diagnosis Date  . Kidney stones   . Hydronephrosis   . GERD (gastroesophageal reflux disease)    Past Surgical History  Procedure Laterality Date  . Hernia repair    . Cesarean section     Social History:  reports that she has never smoked. She does not have any smokeless tobacco history on file. She reports that she drinks alcohol. She reports that she does not use illicit drugs.  No Known Allergies  No family history on file.   Prior to Admission medications   Medication Sig Start Date End Date Taking? Authorizing Provider  HYDROcodone-acetaminophen (NORCO/VICODIN) 5-325 MG per tablet Take 1 tablet by mouth every 6 (six) hours as needed. 04/22/14   Varney Biles, MD  loratadine (CLARITIN) 10 MG tablet Take 10 mg by mouth at bedtime as needed for allergies.    Historical Provider, MD  naphazoline-pheniramine (NAPHCON-A) 0.025-0.3 % ophthalmic solution Place 2 drops into both eyes 4 (four) times daily as needed for irritation.    Historical Provider, MD  omeprazole  (PRILOSEC) 20 MG capsule Take 1 capsule by mouth at bedtime as needed (acid reflux).  02/11/14   Historical Provider, MD  ondansetron (ZOFRAN ODT) 8 MG disintegrating tablet Take 1 tablet (8 mg total) by mouth every 8 (eight) hours as needed for nausea. 04/22/14   Varney Biles, MD   Physical Exam: Filed Vitals:   06/28/14 0103  BP: 111/60  Pulse: 94  Temp: 97.7 F (36.5 C)  Resp: 16    BP 111/60  Pulse 94  Temp(Src) 97.7 F (36.5 C) (Oral)  Resp 16  Ht 5\' 5"  (1.651 m)  Wt 70.308 kg (155 lb)  BMI 25.79 kg/m2  SpO2 99%  LMP 06/20/2014  General Appearance:    Alert, oriented, no distress, appears stated age  Head:    Normocephalic, atraumatic  Eyes:    PERRL, EOMI, sclera non-icteric        Nose:   Nares without drainage or epistaxis. Mucosa, turbinates normal  Throat:   Moist mucous membranes. Oropharynx without erythema or exudate.  Neck:   Supple. No carotid bruits.  No thyromegaly.  No lymphadenopathy.   Back:     No CVA tenderness, no spinal tenderness  Lungs:     Clear to auscultation bilaterally, without wheezes, rhonchi or rales  Chest wall:    No tenderness to palpitation  Heart:    Regular rate and rhythm without murmurs, gallops, rubs  Abdomen:     Soft, non-tender, nondistended, normal bowel sounds, no organomegaly  Genitalia:  deferred  Rectal:    deferred  Extremities:   No clubbing, cyanosis or edema.  Pulses:   2+ and symmetric all extremities  Skin:   Skin color, texture, turgor normal, no rashes or lesions  Lymph nodes:   Cervical, supraclavicular, and axillary nodes normal  Neurologic:   CNII-XII intact. Normal strength, sensation and reflexes      throughout    Labs on Admission:  Basic Metabolic Panel:  Recent Labs Lab 06/27/14 2040  NA 134*  K 3.5*  CL 95*  CO2 23  GLUCOSE 114*  BUN 10  CREATININE 1.10  CALCIUM 9.2   Liver Function Tests:  Recent Labs Lab 06/27/14 2040  AST 18  ALT 11  ALKPHOS 55  BILITOT 0.8  PROT 7.6   ALBUMIN 3.7    Recent Labs Lab 06/27/14 2040  LIPASE 19   No results found for this basename: AMMONIA,  in the last 168 hours CBC:  Recent Labs Lab 06/27/14 2040  WBC 11.3*  NEUTROABS 8.8*  HGB 13.2  HCT 38.0  MCV 92.7  PLT 166   Cardiac Enzymes: No results found for this basename: CKTOTAL, CKMB, CKMBINDEX, TROPONINI,  in the last 168 hours  BNP (last 3 results) No results found for this basename: PROBNP,  in the last 8760 hours CBG: No results found for this basename: GLUCAP,  in the last 168 hours  Radiological Exams on Admission: Dg Chest 2 View  06/27/2014   CLINICAL DATA:  Fever for 1 day.  Low back pain.  EXAM: CHEST  2 VIEW  COMPARISON:  None.  FINDINGS: The heart size and mediastinal contours are within normal limits. Both lungs are clear. The visualized skeletal structures are unremarkable.  IMPRESSION: No active cardiopulmonary disease.   Electronically Signed   By: Lucienne Capers M.D.   On: 06/27/2014 21:29   Ct Renal Stone Study  06/27/2014   CLINICAL DATA:  Left flank pain associated with nausea fever ; history of urinary tract stones  EXAM: CT ABDOMEN AND PELVIS WITHOUT CONTRAST  TECHNIQUE: Multidetector CT imaging of the abdomen and pelvis was performed following the standard protocol without IV contrast.  COMPARISON:  Supine abdominal film of May 18, 2014 and CT scan of the abdomen and pelvis of May 03, 2014  FINDINGS: Again demonstrated is moderate left-sided hydronephrosis apparently secondary to ureteropelvic junction obstruction. There are 2 nonobstructing mid to lower polestones on the left with the largest measuring 4 mm in diameter. There is a stable 1.5 cm diameter midpole cyst. On the right there are nonobstructing stones measuring up to 2 mm in diameter. No ureteral or bladder stones are demonstrated. The perinephric fat is normal in density.  The liver, gallbladder, spleen, pancreas, adrenal glands, and abdominal aorta are unremarkable. There is a  punctate calcification in the right hepatic lobe. The stomach, small bowel, and large bowel are normal. There is a normal appendix. There is an IUD present. There is no adnexal mass nor significant free pelvic fluid.  The lumbar spine and bony pelvis exhibit no acute abnormalities. The lung bases are clear.  IMPRESSION: 1. 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 No obstructing stone is demonstrated here. There are nonobstructing stones in both kidneys. 2. No acute intra-abdominal or pelvic abnormality is demonstrated elsewhere.   Electronically Signed   By: David  Martinique   On: 06/27/2014 22:28    EKG: Independently reviewed.  Assessment/Plan Active Problems:   Pyelonephritis   1.  Pyelonephritis - 1. Rocephin 2. IVF 3. Urine cultures pending 4. Urology consulted given hydronephrosis see below. 5. Repeat labs in AM.  Urology was consulted by EDP but at this point they do not feel that intervention is nessacary as patients hydronephrosis is chronic and likely kidney is still draining.  Code Status: Full  Family Communication: No family in room Disposition Plan: Admit to inpatient   Time spent: 50 min  Safiyyah Vasconez M. [facility] Pager [phone]  If 7AM-7PM, please contact the day team taking care of the patient [email or web address] Password [email or web address] 06/28/2014, 1:09 AM

## 2014-06-29 DIAGNOSIS — D72829 Elevated white blood cell count, unspecified: Secondary | ICD-10-CM

## 2014-06-29 DIAGNOSIS — E876 Hypokalemia: Secondary | ICD-10-CM

## 2014-06-29 MED ORDER — LEVOFLOXACIN 750 MG PO TABS: 750.0000 mg | ORAL_TABLET | Freq: Every day | ORAL | Status: DC

## 2014-06-29 MED ORDER — FLUCONAZOLE 150 MG PO TABS: 150.0000 mg | ORAL_TABLET | Freq: Every day | ORAL | Status: DC | PRN

## 2014-06-29 NOTE — Discharge Instructions (Signed)

## 2014-06-29 NOTE — Progress Notes (Signed)
Pt discharged home with mother in law in stable condition. Discharge instructions and scripts given. Pt verbalized understanding.

## 2014-06-29 NOTE — Discharge Summary (Signed)
Physician Discharge Summary  Diane Holt JJK:[id number] DOB: May 09, 1971 DOA: 06/27/2014  PCP: No primary provider on file.  Admit date: 06/27/2014 Discharge date: 06/29/2014  Recommendations for Outpatient Follow-up:  Continue Levaquin for 5 more days on discharge for urinary tract infection.  Discharge Diagnoses:  Principal Problem:   Pyelonephritis    Discharge Condition: stable   Diet recommendation: as tolerated   History of present illness:   43 y.o. female history of kidney stones and chronic hydronephrosis of the left kidney who presented to [facility] 06/27/2014 with left flank pain and fevers of up to 103 F prior to this admission. Hydronephrosis is followed by urology, however given that her renal function is normal and hydronephrosis is unchanging they have not intervened as it is likely that her kidney is still draining some urine. Pt was slightly hypotensive on admission but her BP responded to IV fluids. UA revealed many bacteria and pt was started on rocephin.   Assessment/Plan:   Principal Problem:  Acute pyelonephritis / E.Coli UTI UA on admission showed moderate leukocytes and many bacteria. Patient was started on rocephin 1 gm IV daily.  Urine culture is growing E.Coli. Continue Levaquin for 5 days on discharge.  Active Problems:  Sepsis secondary to UTI / pyelonephritis.  Sepsis criteria met with initial vitals that included fever, tachycardia, hypotension and blood work significant for leukocytosis of 11.3. In addition, patient has evidence of infection based on UA which revealed moderate leukocytes and many bacteria.  Patient started on Rocephin IV 1 gm daily. 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  Urology was consulted by EDP but at this point they do not feel that  intervention is nessacary as patients hydronephrosis is chronic and likely kidney is still draining. DVT Prophylaxis  Heparin subQ ordered.   Code Status: Full.  Family Communication: plan of care discussed with the patient   IV Access:   Peripheral IV Procedures and diagnostic studies:   Dg Chest 2 View 06/27/2014 No active cardiopulmonary disease. Electronically Signed By: Lucienne Capers M.D. On: 06/27/2014 21:29  Ct Renal Stone Study 06/27/2014 1. 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 No obstructing stone is demonstrated here. There are nonobstructing stones in both kidneys. 2. No acute intra-abdominal or pelvic abnormality is demonstrated elsewhere.  Medical Consultants:   None  Other Consultants:   None  Anti-Infectives:   Rocephin 06/27/2014 --> 06/29/2014   Signed:  Leisa Lenz, MD  [facility] 06/29/2014, 11:12 AM  Pager #: [phone]   Discharge Exam: Filed Vitals:   06/29/14 0525  BP: 113/64  Pulse: 74  Temp: 99.2 F (37.3 C)  Resp: 16   Filed Vitals:   06/28/14 0445 06/28/14 1437 06/28/14 2040 06/29/14 0525  BP: 97/53 109/63 110/65 113/64  Pulse: 89 78 79 74  Temp: 99.1 F (37.3 C) 98.4 F (36.9 C) 98.8 F (37.1 C) 99.2 F (37.3 C)  TempSrc: Oral Oral Oral Oral  Resp: 16 16 15 16   Height:      Weight:      SpO2: 98% 99% 96% 98%    General: Pt is alert, follows commands appropriately, not in acute distress Cardiovascular: Regular rate and rhythm, S1/S2 +, no murmurs Respiratory: Clear to auscultation bilaterally, no wheezing, no crackles, no rhonchi Abdominal: Soft, non tender, non distended, bowel sounds +, no guarding Extremities: no  edema, no cyanosis, pulses palpable bilaterally DP and PT Neuro: Grossly nonfocal  Discharge Instructions  Discharge Instructions   Call MD for:  difficulty breathing, headache or visual disturbances    Complete by:  As directed       Call MD for:  persistant dizziness or light-headedness    Complete by:  As directed      Call MD for:  persistant nausea and vomiting    Complete by:  As directed      Call MD for:  severe uncontrolled pain    Complete by:  As directed      Diet - low sodium heart healthy    Complete by:  As directed      Discharge instructions    Complete by:  As directed   Continue Levaquin for 5 more days on discharge for urinary tract infection.     Increase activity slowly    Complete by:  As directed             Medication List    STOP taking these medications       cephALEXin 500 MG capsule  Commonly known as:  KEFLEX      TAKE these medications       fluconazole 150 MG tablet  Commonly known as:  DIFLUCAN  Take 1 tablet (150 mg total) by mouth daily as needed. When taking keflex     HYDROcodone-acetaminophen 5-325 MG per tablet  Commonly known as:  NORCO/VICODIN  Take 1 tablet by mouth every 6 (six) hours as needed.     levofloxacin 750 MG tablet  Commonly known as:  LEVAQUIN  Take 1 tablet (750 mg total) by mouth daily.     loratadine 10 MG tablet  Commonly known as:  CLARITIN  Take 10 mg by mouth at bedtime as needed for allergies.     meloxicam 15 MG tablet  Commonly known as:  MOBIC  Take 1 tablet by mouth daily as needed. pain     omeprazole 20 MG capsule  Commonly known as:  PRILOSEC  Take 1 capsule by mouth at bedtime as needed (acid reflux).           Follow-up Information   Schedule an appointment as soon as possible for a visit with Festus Aloe, MD. (As needed)    Specialty:  Urology   Contact information:   [place] [address] [phone]        The results of significant diagnostics from this hospitalization (including imaging, microbiology, ancillary and laboratory) are listed below for reference.    Significant Diagnostic Studies: Dg Chest 2 View  06/27/2014   CLINICAL DATA:  Fever for 1 day.  Low back pain.  EXAM:  CHEST  2 VIEW  COMPARISON:  None.  FINDINGS: The heart size and mediastinal contours are within normal limits. Both lungs are clear. The visualized skeletal structures are unremarkable.  IMPRESSION: No active cardiopulmonary disease.   Electronically Signed   By: Lucienne Capers M.D.   On: 06/27/2014 21:29   Ct Renal Stone Study  06/27/2014   CLINICAL DATA:  Left flank pain associated with nausea fever ; history of urinary tract stones  EXAM: CT ABDOMEN AND PELVIS WITHOUT CONTRAST  TECHNIQUE: Multidetector CT imaging of the abdomen and pelvis was performed following the standard protocol without IV contrast.  COMPARISON:  Supine abdominal film of May 18, 2014 and CT scan of the abdomen and pelvis of May 03, 2014  FINDINGS:  Again demonstrated is moderate left-sided hydronephrosis apparently secondary to ureteropelvic junction obstruction. There are 2 nonobstructing mid to lower polestones on the left with the largest measuring 4 mm in diameter. There is a stable 1.5 cm diameter midpole cyst. On the right there are nonobstructing stones measuring up to 2 mm in diameter. No ureteral or bladder stones are demonstrated. The perinephric fat is normal in density.  The liver, gallbladder, spleen, pancreas, adrenal glands, and abdominal aorta are unremarkable. There is a punctate calcification in the right hepatic lobe. The stomach, small bowel, and large bowel are normal. There is a normal appendix. There is an IUD present. There is no adnexal mass nor significant free pelvic fluid.  The lumbar spine and bony pelvis exhibit no acute abnormalities. The lung bases are clear.  IMPRESSION: 1. 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 No obstructing stone is demonstrated here. There are nonobstructing stones in both kidneys. 2. No acute intra-abdominal or pelvic abnormality is demonstrated elsewhere.   Electronically Signed   By: David  Martinique   On:  06/27/2014 22:28    Microbiology: Recent Results (from the past 240 hour(s))  CULTURE, BLOOD (ROUTINE X 2)     Status: None   Collection Time    06/27/14  8:40 PM      Result Value Ref Range Status   Specimen Description BLOOD RIGHT ANTECUBITAL   Final   Special Requests     Final   Value: BOTTLES DRAWN AEROBIC AND ANAEROBIC AER 6cc ANA 6cc   Culture  Setup Time     Final   Value: 06/28/2014 00:15     Performed at [facility]   Culture     Final   Value:        BLOOD CULTURE RECEIVED NO GROWTH TO DATE CULTURE WILL BE HELD FOR 5 DAYS BEFORE ISSUING A FINAL NEGATIVE REPORT     Performed at [facility]   Report Status PENDING   Incomplete  CULTURE, BLOOD (ROUTINE X 2)     Status: None   Collection Time    06/27/14  8:53 PM      Result Value Ref Range Status   Specimen Description BLOOD LEFT ANTECUBITAL   Final   Special Requests     Final   Value: BOTTLES DRAWN AEROBIC AND ANAEROBIC AER 5cc ANA 6cc   Culture  Setup Time     Final   Value: 06/28/2014 00:15     Performed at [facility]   Culture     Final   Value:        BLOOD CULTURE RECEIVED NO GROWTH TO DATE CULTURE WILL BE HELD FOR 5 DAYS BEFORE ISSUING A FINAL NEGATIVE REPORT     Performed at [facility]   Report Status PENDING   Incomplete     Labs: Basic Metabolic Panel:  Recent Labs Lab 06/27/14 2040 06/28/14 0413  NA 134* 141  K 3.5* 4.1  CL 95* 103  CO2 23 25  GLUCOSE 114* 105*  BUN 10 8  CREATININE 1.10 1.00  CALCIUM 9.2 8.6  MG  --  1.8   Liver Function Tests:  Recent Labs Lab 06/27/14 2040 06/28/14 0413  AST 18 16  ALT 11 10  ALKPHOS 55 50  BILITOT 0.8 0.5  PROT 7.6 7.0  ALBUMIN 3.7 3.4*    Recent Labs Lab 06/27/14 2040  LIPASE 19   No results found for this basename: AMMONIA,  in the last 168 hours CBC:  Recent Labs Lab 06/27/14 2040 06/28/14 0413  WBC 11.3* 10.9*  NEUTROABS 8.8* 7.2  HGB 13.2 12.8  HCT 38.0 37.7  MCV 92.7 93.5  PLT 166 166    Cardiac Enzymes: No results found for this basename: CKTOTAL, CKMB, CKMBINDEX, TROPONINI,  in the last 168 hours BNP: BNP (last 3 results) No results found for this basename: PROBNP,  in the last 8760 hours CBG: No results found for this basename: GLUCAP,  in the last 168 hours  Time coordinating discharge: Over 30 minutes

## 2014-06-30 LAB — URINE CULTURE: Colony Count: >=100000

## 2014-07-04 LAB — CULTURE, BLOOD (ROUTINE X 2)
CULTURE: NO GROWTH
Culture: NO GROWTH

## 2014-07-11 ENCOUNTER — Other Ambulatory Visit: Payer: Self-pay | Admitting: Urology

## 2014-07-11 DIAGNOSIS — Q6239 Other obstructive defects of renal pelvis and ureter: Secondary | ICD-10-CM

## 2014-07-11 DIAGNOSIS — Q6211 Congenital occlusion of ureteropelvic junction: Principal | ICD-10-CM

## 2014-07-12 ENCOUNTER — Other Ambulatory Visit (HOSPITAL_COMMUNITY): Payer: Self-pay | Admitting: Urology

## 2014-07-12 DIAGNOSIS — Q6211 Congenital occlusion of ureteropelvic junction: Principal | ICD-10-CM

## 2014-07-12 DIAGNOSIS — Q6239 Other obstructive defects of renal pelvis and ureter: Secondary | ICD-10-CM

## 2014-07-18 ENCOUNTER — Ambulatory Visit (HOSPITAL_COMMUNITY)
Admission: RE | Admit: 2014-07-18 | Discharge: 2014-07-18 | Disposition: A | Discharge status: Home / Self Care | Payer: BC Managed Care – PPO | Source: Ambulatory Visit | Attending: Urology | Admitting: Urology

## 2014-07-18 DIAGNOSIS — Q6211 Congenital occlusion of ureteropelvic junction: Secondary | ICD-10-CM | POA: Diagnosis present

## 2014-07-18 DIAGNOSIS — Q6239 Other obstructive defects of renal pelvis and ureter: Secondary | ICD-10-CM

## 2014-07-18 MED ORDER — TECHNETIUM TC 99M MERTIATIDE
15.0000 | Freq: Once | INTRAVENOUS | Status: AC | PRN
  Administered 2014-07-18: 15 via INTRAVENOUS

## 2014-07-18 MED ORDER — FUROSEMIDE 10 MG/ML IJ SOLN
40.0000 mg | Freq: Once | INTRAMUSCULAR | Status: DC
  Filled 2014-07-18: qty 4

## 2014-08-02 ENCOUNTER — Other Ambulatory Visit: Payer: Self-pay | Admitting: Internal Medicine

## 2015-09-29 HISTORY — PX: ROTATOR CUFF REPAIR: SHX139

## 2015-12-09 DIAGNOSIS — M47815 Spondylosis without myelopathy or radiculopathy, thoracolumbar region: Secondary | ICD-10-CM | POA: Insufficient documentation

## 2015-12-09 DIAGNOSIS — K219 Gastro-esophageal reflux disease without esophagitis: Secondary | ICD-10-CM | POA: Insufficient documentation

## 2015-12-09 DIAGNOSIS — Q6239 Other obstructive defects of renal pelvis and ureter: Secondary | ICD-10-CM | POA: Insufficient documentation

## 2015-12-09 DIAGNOSIS — F32A Depression, unspecified: Secondary | ICD-10-CM | POA: Insufficient documentation

## 2015-12-09 DIAGNOSIS — F309 Manic episode, unspecified: Secondary | ICD-10-CM | POA: Insufficient documentation

## 2015-12-09 DIAGNOSIS — D239 Other benign neoplasm of skin, unspecified: Secondary | ICD-10-CM | POA: Insufficient documentation

## 2015-12-09 DIAGNOSIS — M4124 Other idiopathic scoliosis, thoracic region: Secondary | ICD-10-CM | POA: Insufficient documentation

## 2016-04-27 DIAGNOSIS — Z975 Presence of (intrauterine) contraceptive device: Secondary | ICD-10-CM | POA: Insufficient documentation

## 2016-07-14 DIAGNOSIS — M75101 Unspecified rotator cuff tear or rupture of right shoulder, not specified as traumatic: Secondary | ICD-10-CM | POA: Insufficient documentation

## 2016-08-28 DIAGNOSIS — Z9889 Other specified postprocedural states: Secondary | ICD-10-CM | POA: Insufficient documentation

## 2017-04-08 DIAGNOSIS — R0989 Other specified symptoms and signs involving the circulatory and respiratory systems: Secondary | ICD-10-CM | POA: Insufficient documentation

## 2017-06-01 DIAGNOSIS — Z9889 Other specified postprocedural states: Secondary | ICD-10-CM | POA: Insufficient documentation

## 2017-12-14 ENCOUNTER — Other Ambulatory Visit: Payer: Self-pay | Admitting: Orthopedic Surgery

## 2017-12-14 DIAGNOSIS — M5416 Radiculopathy, lumbar region: Secondary | ICD-10-CM

## 2018-01-13 ENCOUNTER — Other Ambulatory Visit: Payer: Self-pay | Admitting: Orthopedic Surgery

## 2018-01-13 DIAGNOSIS — M533 Sacrococcygeal disorders, not elsewhere classified: Secondary | ICD-10-CM

## 2018-01-25 ENCOUNTER — Ambulatory Visit
Admission: RE | Admit: 2018-01-25 | Discharge: 2018-01-25 | Disposition: A | Discharge status: Home / Self Care | Payer: 59 | Source: Ambulatory Visit | Attending: Orthopedic Surgery | Admitting: Orthopedic Surgery

## 2018-01-25 DIAGNOSIS — M533 Sacrococcygeal disorders, not elsewhere classified: Secondary | ICD-10-CM

## 2018-05-19 DIAGNOSIS — G5701 Lesion of sciatic nerve, right lower limb: Secondary | ICD-10-CM | POA: Insufficient documentation

## 2018-06-03 ENCOUNTER — Ambulatory Visit: Payer: 59 | Admitting: Diagnostic Neuroimaging

## 2018-07-13 ENCOUNTER — Other Ambulatory Visit: Payer: Self-pay | Admitting: *Deleted

## 2018-07-13 ENCOUNTER — Encounter: Payer: Self-pay | Admitting: *Deleted

## 2018-07-13 ENCOUNTER — Ambulatory Visit: Payer: 59 | Admitting: Diagnostic Neuroimaging

## 2018-07-13 ENCOUNTER — Telehealth: Payer: Self-pay | Admitting: Diagnostic Neuroimaging

## 2018-07-13 VITALS — BP 116/69 | HR 69 | Ht 65.0 in | Wt 157.4 lb

## 2018-07-13 DIAGNOSIS — M79604 Pain in right leg: Secondary | ICD-10-CM | POA: Diagnosis not present

## 2018-07-13 DIAGNOSIS — R200 Anesthesia of skin: Secondary | ICD-10-CM | POA: Diagnosis not present

## 2018-07-13 MED ORDER — ALPRAZOLAM 0.5 MG PO TABS: 0.5000 mg | ORAL_TABLET | ORAL | 0 refills | Status: AC | PRN

## 2018-07-13 NOTE — Progress Notes (Signed)
[facility]  PATIENT: Diane Holt DOB: 23-Oct-1970  REFERRING CLINICIAN: dumonski HISTORY FROM: patient and chart review  REASON FOR VISIT: new consult    HISTORICAL  CHIEF COMPLAINT:  Chief Complaint  Patient presents with  . Pain    rm 7, New Pt, "diagnosed with piriformis syndrome; pain in right hip area since Jan, began with terrible cough"    HISTORY OF PRESENT ILLNESS:  47 year old female here for evaluation of right leg pain.  January 2019 patient had upper respiratory infection with severe cough and soon after she developed pain in her right hip radiating down to her right foot.  When she would stand up she would feel significant pain and muscle spasms in her right buttock, hamstring and lower leg.  She would have intermittent right foot numbness.  No problems with the left leg.  No problems with hands or arms.  No facial numbness, vision changes or slurred speech.  Patient went to chiropractor and was diagnosed with unequal leg length and recommended to have various treatments, but insurance did not cover these procedures and therefore patient declined.  Patient went to [facility] orthopedic clinic, had MRI of the lumbar spine which showed no definitive etiology of symptoms.  Patient was treated empirically with SI joint injections without relief.  She was then diagnosed with possible piriformis syndrome and treated with injection without relief.  Patient has been taking Advil on a daily basis, 6 to 8 tablets/day without relief.  She is tried muscle relaxers including Soma which caused side effects.  She tried Mobic without relief.  Yoga stretching and exercises seem to help a little bit.  She has not tried physical therapy yet.  Of note in 2011 patient was living in [place] and had a 1 month episode of memory loss, slurred speech, tingling in bilateral feet.  He had MS evaluation at that time which apparently was negative.  Patient remembers MRI of the  brain and EMG electrical testing being done and apparently were negative.  We do not have these records to review today.    REVIEW OF SYSTEMS: Full 14 system review of systems performed and negative with exception of: As per HPI otherwise negative.  ALLERGIES: Allergies  Allergen Reactions  . Carisoprodol Rash    Face turned red    HOME MEDICATIONS: Outpatient Medications Prior to Visit  Medication Sig Dispense Refill  . fluticasone (FLONASE) 50 MCG/ACT nasal spray Place into the nose.    Marland Kitchen HYDROcodone-acetaminophen (NORCO/VICODIN) 5-325 MG per tablet Take 1 tablet by mouth every 6 (six) hours as needed. 15 tablet 0  . ibuprofen (ADVIL,MOTRIN) 200 MG tablet Take 200 mg by mouth every 6 (six) hours as needed.    Marland Kitchen levonorgestrel (MIRENA) 20 MCG/24HR IUD by Intrauterine route.    . loratadine (CLARITIN) 10 MG tablet Take 10 mg by mouth at bedtime as needed for allergies.    Marland Kitchen omeprazole (PRILOSEC) 20 MG capsule Take 1 capsule by mouth at bedtime as needed (acid reflux).     . budesonide-formoterol (SYMBICORT) 160-4.5 MCG/ACT inhaler TK 2 PUFFFS PO BID    . fluconazole (DIFLUCAN) 150 MG tablet Take 1 tablet (150 mg total) by mouth daily as needed. When taking keflex 5 tablet 0  . levofloxacin (LEVAQUIN) 750 MG tablet Take 1 tablet (750 mg total) by mouth daily. 5 tablet 0  . meloxicam (MOBIC) 15 MG tablet Take 1 tablet by mouth daily as needed. pain     No facility-administered medications prior  to visit.     PAST MEDICAL HISTORY: Past Medical History:  Diagnosis Date  . GERD (gastroesophageal reflux disease)   . Hydronephrosis   . Kidney stones     PAST SURGICAL HISTORY: Past Surgical History:  Procedure Laterality Date  . CESAREAN SECTION    . HERNIA REPAIR     umbilical  . ROTATOR CUFF REPAIR Right 2017    FAMILY HISTORY: Family History  Problem Relation Age of Onset  . Osteoarthritis Mother   . Heart disease Maternal Grandfather   . Diabetes Paternal Grandmother     . Heart disease Paternal Grandfather     SOCIAL HISTORY: Social History   Socioeconomic History  . Marital status: Divorced    Spouse name: Not on file  . Number of children: 1  . Years of education: college  . Highest education level: Not on file  Occupational History    Comment: sales,[employer]  Social Needs  . Financial resource strain: Not on file  . Food insecurity:    Worry: Not on file    Inability: Not on file  . Transportation needs:    Medical: Not on file    Non-medical: Not on file  Tobacco Use  . Smoking status: Never Smoker  . Smokeless tobacco: Never Used  Substance and Sexual Activity  . Alcohol use: Yes    Comment: 2-4 monthly  . Drug use: No  . Sexual activity: Yes    Birth control/protection: IUD  Lifestyle  . Physical activity:    Days per week: Not on file    Minutes per session: Not on file  . Stress: Not on file  Relationships  . Social connections:    Talks on phone: Not on file    Gets together: Not on file    Attends religious service: Not on file    Active member of club or organization: Not on file    Attends meetings of clubs or organizations: Not on file    Relationship status: Not on file  . Intimate partner violence:    Fear of current or ex partner: Not on file    Emotionally abused: Not on file    Physically abused: Not on file    Forced sexual activity: Not on file  Other Topics Concern  . Not on file  Social History Narrative   Lives alone   Caffeine- coffee 1 cup, soda 1 daily     PHYSICAL EXAM  GENERAL EXAM/CONSTITUTIONAL: Vitals:  Vitals:   07/13/18 0947  BP: 116/69  Pulse: 69  Weight: 157 lb 6.4 oz (71.4 kg)  Height: 5\' 5"  (1.651 m)     Body mass index is 26.19 kg/m. Wt Readings from Last 3 Encounters:  07/13/18 157 lb 6.4 oz (71.4 kg)  07/18/14 155 lb (70.3 kg)  06/27/14 155 lb (70.3 kg)     Patient is in no distress; well developed, nourished and groomed; neck is  supple  CARDIOVASCULAR:  Examination of carotid arteries is normal; no carotid bruits  Regular rate and rhythm, no murmurs  Examination of peripheral vascular system by observation and palpation is normal  EYES:  Ophthalmoscopic exam of optic discs and posterior segments is normal; no papilledema or hemorrhages  Visual Acuity Screening   Right eye Left eye Both eyes  Without correction:     With correction: 20/30 20/30      MUSCULOSKELETAL:  Gait, strength, tone, movements noted in Neurologic exam below  NEUROLOGIC: MENTAL STATUS:  No flowsheet data found.  awake, alert, oriented to person, place and time  recent and remote memory intact  normal attention and concentration  language fluent, comprehension intact, naming intact  fund of knowledge appropriate  CRANIAL NERVE:   2nd - no papilledema on fundoscopic exam  2nd, 3rd, 4th, 6th - pupils equal and reactive to light, visual fields full to confrontation, extraocular muscles intact, no nystagmus  5th - facial sensation symmetric  7th - facial strength symmetric  8th - hearing intact  9th - palate elevates symmetrically, uvula midline  11th - shoulder shrug symmetric  12th - tongue protrusion midline  MOTOR:   normal bulk and tone, full strength in the BUE, BLE; EXCEPT RIGHT HIP FLEXION 4 (LIMITED BY PAIN)  SENSORY:   normal and symmetric to light touch; SLIGHTLY HYPERSENS TO TEMP IN RIGHT FOOT  COORDINATION:   finger-nose-finger, fine finger movements normal  REFLEXES:   deep tendon reflexes present and symmetric  GAIT/STATION:   SLOW TO RISE; ANTALGIC; narrow based gait; able to walk on toes, heels; romberg is negative     DIAGNOSTIC DATA (LABS, IMAGING, TESTING) - I reviewed patient records, labs, notes, testing and imaging myself where available.  Lab Results  Component Value Date   WBC 10.9 (H) 06/28/2014   HGB 12.8 06/28/2014   HCT 37.7 06/28/2014   MCV 93.5 06/28/2014    PLT 166 06/28/2014      Component Value Date/Time   NA 141 06/28/2014 0413   K 4.1 06/28/2014 0413   CL 103 06/28/2014 0413   CO2 25 06/28/2014 0413   GLUCOSE 105 (H) 06/28/2014 0413   BUN 8 06/28/2014 0413   CREATININE 1.00 06/28/2014 0413   CALCIUM 8.6 06/28/2014 0413   PROT 7.0 06/28/2014 0413   ALBUMIN 3.4 (L) 06/28/2014 0413   AST 16 06/28/2014 0413   ALT 10 06/28/2014 0413   ALKPHOS 50 06/28/2014 0413   BILITOT 0.5 06/28/2014 0413   GFRNONAA 68 (L) 06/28/2014 0413   GFRAA 79 (L) 06/28/2014 0413   No results found for: CHOL, HDL, LDLCALC, LDLDIRECT, TRIG, CHOLHDL No results found for: HGBA1C No results found for: VITAMINB12 No results found for: TSH   12/27/17 MRI lumbar [I reviewed images myself and agree with interpretation. -VRP]  - mild disc bulging and degnerative changes; no foraminal stenosis; no spinal stenosis     ASSESSMENT AND PLAN  47 y.o. year old female here with new onset of right lower extremity pain, numbness, muscle spasms since January 2019.  Also with separate 1 month event of slurred speech, memory loss, lower extremity tingling in 2011.  Patient is tried and failed conservative management of SI joint and piriformis syndrome problems.  MRI lumbar spine is unremarkable.  We will proceed with further work-up to rule out alternate central nervous system or peripheral nervous system etiologies.  Ddx: right sciatic neuropathy, right lumbar plexopathy; CNS autoimmune / inflamm / demyelination less likely  1. Right leg numbness   2. Right leg pain     PLAN:  - check MRI brain, cervical and thoracic spine (w/wo); eval for demyelinating disease - may consider EMG/NCS in future if MRIs are negative - offered gabapentin, lyrica or cymbalta for neuropathic pain; pt wants to hold off for now  Meds ordered this encounter  Medications  . ALPRAZolam (XANAX) 0.5 MG tablet    Sig: Take 1 tablet (0.5 mg total) by mouth as needed for anxiety (for sedation before  MRI scan; take 1 hour before scan; may repeat 15  min before scan).    Dispense:  3 tablet    Refill:  0   Orders Placed This Encounter  Procedures  . MR BRAIN W WO CONTRAST  . MR CERVICAL SPINE W WO CONTRAST  . MR THORACIC SPINE W WO CONTRAST   Return for pending test results.    Penni Bombard, MD 83/77/9396, 88:64 AM Certified in Neurology, Neurophysiology and Neuroimaging  [facility] [address] [place], [place] [address] [phone]

## 2018-07-13 NOTE — Patient Instructions (Signed)
Check MRI scans  Consider EMG/NCS (nerve testing)  Continue yoga and PT exercises  Consider massage therapy and acupuncture

## 2018-07-13 NOTE — Telephone Encounter (Signed)
[employer] auth: Brain w/wo contrast: [id number] (exp. 07/13/18 to 08/27/18) Cervical auth: [id number] (exp. 07/13/18 to 08/27/18)  MR Thoracic is pending I faxed clinical notes.

## 2018-07-14 NOTE — Telephone Encounter (Signed)
I called [facility] to check the status they did receive my fax of clinical notes and it is still in pending review.

## 2018-07-18 NOTE — Telephone Encounter (Signed)
[facility] Josem Kaufmann: [id number] (exp. 07/14/18 to 08/28/18)   Left voicemail for patient to call back about scheduling mri.

## 2018-07-18 NOTE — Telephone Encounter (Signed)
I spoke to the patient and informed her of this. For the MRI brain it would cost her about $ 1,709.42 and offered her the payment plan again and she stated she cannot pay that right now.

## 2018-07-18 NOTE — Telephone Encounter (Signed)
Just an FYI  Patient returned my call she is unable to schedule the MRI's right now due to the cost. Since her deductible hasn't been met it would cost her about $2.815.30 to have all 3 images.. I did offer the payment plan but as of right now she is not able to pay that.

## 2018-07-18 NOTE — Telephone Encounter (Signed)
I would recommend checking MRI brain only if possible. -VRP

## 2018-12-22 IMAGING — CT CT BIOPSY
1 of 4 series · 9 of 32 positions shown, 15 images · non-contrast
Comparison: none

CLINICAL DATA: Right posterior pelvic pain. Pain extending into the
right lower extremity.

[Series 2: needle -guided injection · axial · 0.81mm/px · z∈[-93,-27]mm · 9 of 43 slices shown, 15 images]
[im 5/43  soft-tissue]
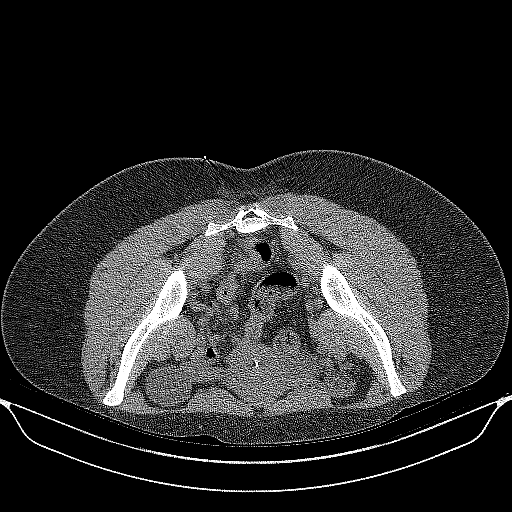
[im 5/43  bone]
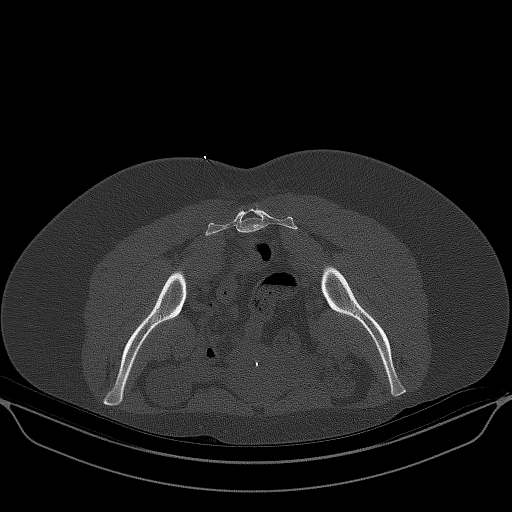
[im 9/43  soft-tissue]
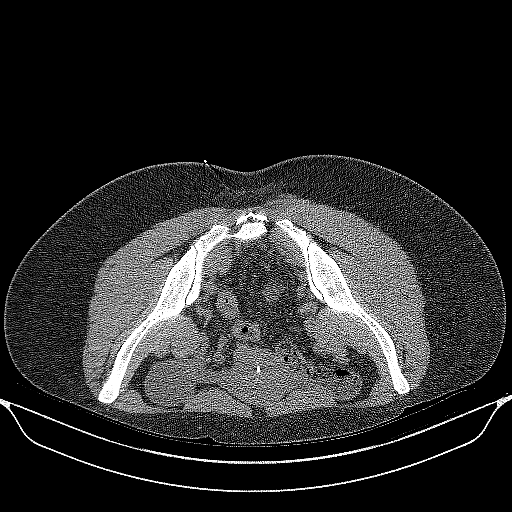
[im 13/43  soft-tissue]
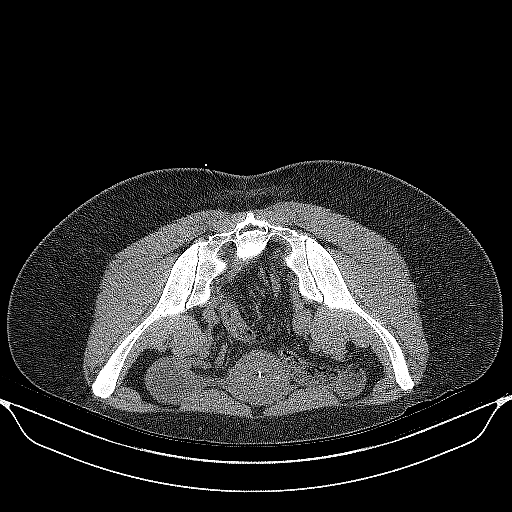
[im 17/43  soft-tissue]
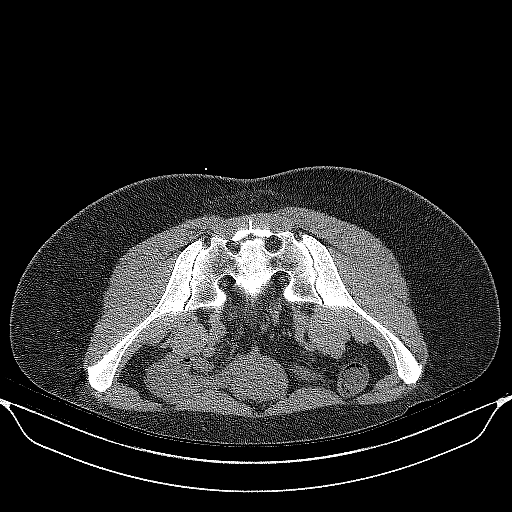
[im 22/43  soft-tissue]
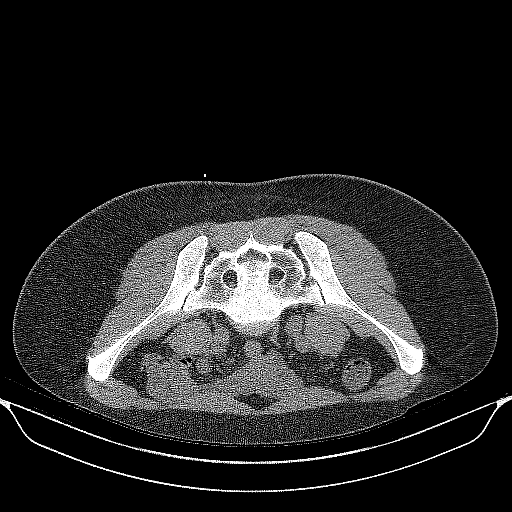
[im 26/43  soft-tissue]
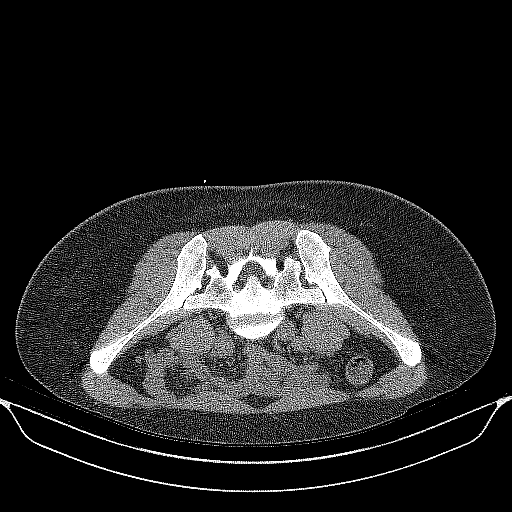
[im 26/43  lung]
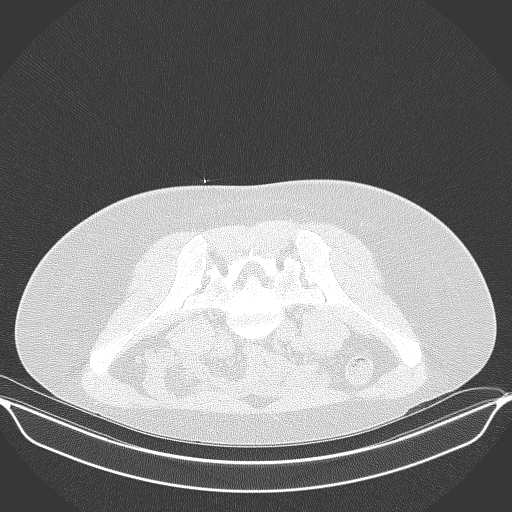
[im 30/43  soft-tissue]
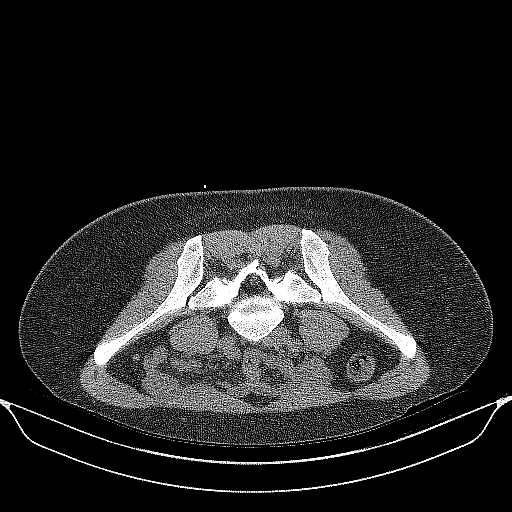
[im 30/43  lung]
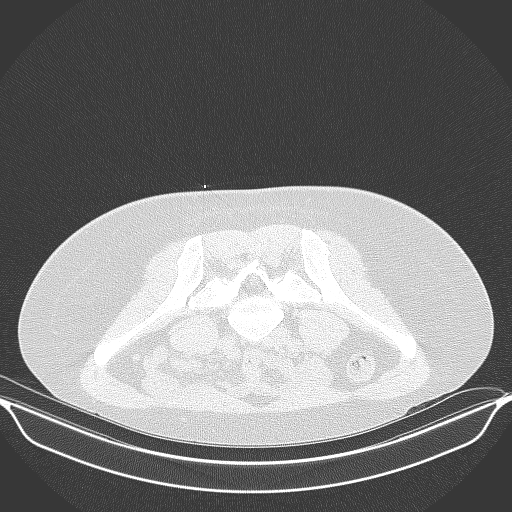
[im 34/43  soft-tissue]
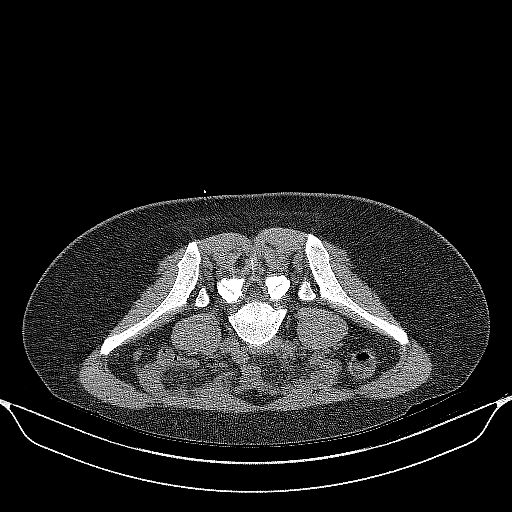
[im 34/43  lung]
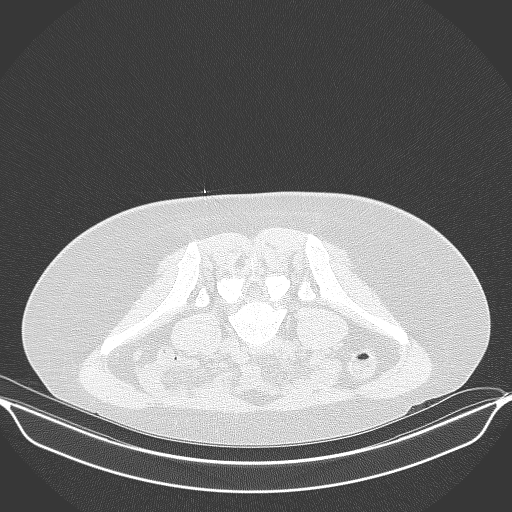
[im 38/43  soft-tissue]
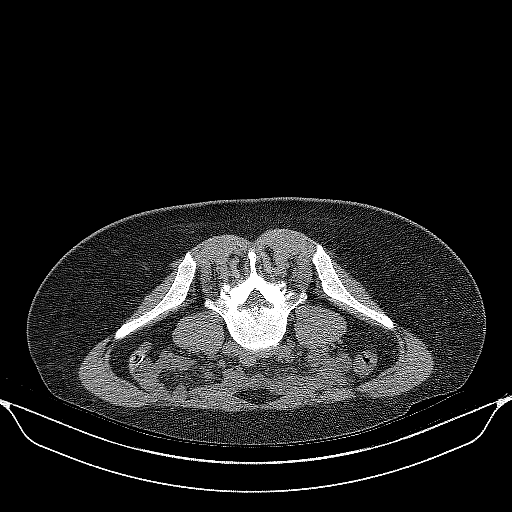
[im 38/43  lung]
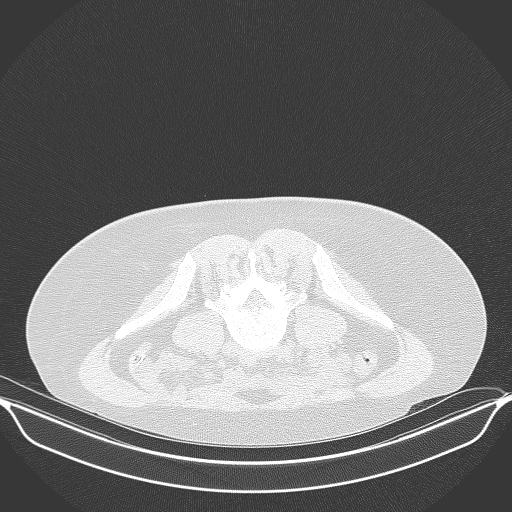
[im 38/43  bone]
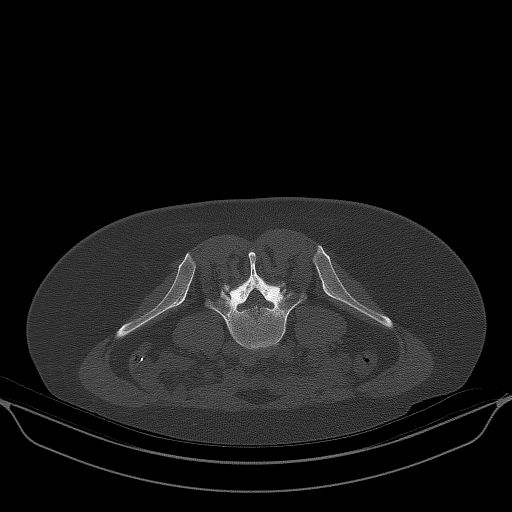

[9 of 32 positions shown; findings below may reference images not displayed]

EXAM:
Right CT GUIDED SI JOINT INJECTION



After local anesthesia with 1% lidocaine without epinephrine and
subsequent deep anesthesia, a 22 gauge spinal needle was advanced
into the right SI joint under intermittent CT guidance.

Once the needle was in satisfactory position, representative image
was captured with the needle demonstrated in the sacroiliac joint.
Subsequently, 2.5 mL 1% lidocaine was injected into the right SI
joint. Needles removed and a sterile dressing applied.

No complications were observed.

Immediately following the procedure, high a walk the patient in the
hallway. She related no immediate relief of pain from the injection.
Of note, she had state at the injection replicated her pain while I
was injecting at the lidocaine.
IMPRESSION: Successful CT-guided right anesthetic only SI joint injection.

## 2019-04-06 DIAGNOSIS — M65311 Trigger thumb, right thumb: Secondary | ICD-10-CM | POA: Insufficient documentation

## 2019-07-25 DIAGNOSIS — M5431 Sciatica, right side: Secondary | ICD-10-CM | POA: Insufficient documentation

## 2019-07-25 DIAGNOSIS — N200 Calculus of kidney: Secondary | ICD-10-CM | POA: Insufficient documentation

## 2020-01-05 DIAGNOSIS — E663 Overweight: Secondary | ICD-10-CM | POA: Insufficient documentation

## 2021-02-12 ENCOUNTER — Other Ambulatory Visit: Payer: Self-pay

## 2021-02-12 DIAGNOSIS — Z1211 Encounter for screening for malignant neoplasm of colon: Secondary | ICD-10-CM

## 2021-02-12 MED ORDER — SUPREP BOWEL PREP KIT 17.5-3.13-1.6 GM/177ML PO SOLN: 1.0000 | ORAL | 0 refills | Status: AC

## 2021-02-20 ENCOUNTER — Telehealth: Payer: Self-pay

## 2021-02-20 NOTE — Telephone Encounter (Signed)
Gastroenterology Pre-Procedure Review  Request Date: 03/17/21 Requesting Physician: Dr. Allen Norris  PATIENT REVIEW QUESTIONS: The patient responded to the following health history questions as indicated:    1. Are you having any GI issues? no 2. Do you have a personal history of Polyps? no 3. Do you have a family history of Colon Cancer or Polyps? no 4. Diabetes Mellitus? no 5. Joint replacements in the past 12 months?no 6. Major health problems in the past 3 months?no 7. Any artificial heart valves, MVP, or defibrillator?no    MEDICATIONS & ALLERGIES:    Patient reports the following regarding taking any anticoagulation/antiplatelet therapy:   Plavix, Coumadin, Eliquis, Xarelto, Lovenox, Pradaxa, Brilinta, or Effient? no Aspirin? no  Patient confirms/reports the following medications:  Current Outpatient Medications  Medication Sig Dispense Refill  . ALPRAZolam (XANAX) 0.5 MG tablet Take 1 tablet (0.5 mg total) by mouth as needed for anxiety (for sedation before MRI scan; take 1 hour before scan; may repeat 15 min before scan). 3 tablet 0  . fluticasone (FLONASE) 50 MCG/ACT nasal spray Place into the nose.    Marland Kitchen HYDROcodone-acetaminophen (NORCO/VICODIN) 5-325 MG per tablet Take 1 tablet by mouth every 6 (six) hours as needed. 15 tablet 0  . ibuprofen (ADVIL,MOTRIN) 200 MG tablet Take 200 mg by mouth every 6 (six) hours as needed.    Marland Kitchen levonorgestrel (MIRENA) 20 MCG/24HR IUD by Intrauterine route.    . loratadine (CLARITIN) 10 MG tablet Take 10 mg by mouth at bedtime as needed for allergies.    . Na Sulfate-K Sulfate-Mg Sulf (SUPREP BOWEL PREP KIT) 17.5-3.13-1.6 GM/177ML SOLN Take 1 kit by mouth as directed. 354 mL 0  . omeprazole (PRILOSEC) 20 MG capsule Take 1 capsule by mouth at bedtime as needed (acid reflux).      No current facility-administered medications for this visit.    Patient confirms/reports the following allergies:  Allergies  Allergen Reactions  . Carisoprodol Rash     Face turned red    No orders of the defined types were placed in this encounter.   AUTHORIZATION INFORMATION Primary Insurance: 1D#: Group #:  Secondary Insurance: 1D#: Group #:  SCHEDULE INFORMATION: Date:  Time: Location:

## 2021-03-06 ENCOUNTER — Telehealth: Payer: Self-pay | Admitting: Gastroenterology

## 2021-03-06 NOTE — Telephone Encounter (Signed)
Patient needs to reschedule procedure

## 2021-03-07 NOTE — Telephone Encounter (Signed)
Pt rescheduled Colonoscopy to 05/23/21.

## 2021-05-23 ENCOUNTER — Encounter: Admission: RE | Payer: Self-pay | Source: Home / Self Care

## 2021-05-23 ENCOUNTER — Ambulatory Visit
Admission: RE | Admit: 2021-05-23 | Payer: Managed Care, Other (non HMO) | Source: Home / Self Care | Admitting: Gastroenterology

## 2021-05-23 SURGERY — COLONOSCOPY WITH PROPOFOL
Anesthesia: Choice

## 2022-08-26 ENCOUNTER — Ambulatory Visit
Admission: RE | Admit: 2022-08-26 | Discharge: 2022-08-26 | Disposition: A | Discharge status: Home / Self Care | Payer: BC Managed Care – PPO | Source: Ambulatory Visit

## 2022-08-26 VITALS — BP 134/87 | HR 76 | Temp 99.4°F | Resp 18

## 2022-08-26 DIAGNOSIS — B3731 Acute candidiasis of vulva and vagina: Secondary | ICD-10-CM

## 2022-08-26 DIAGNOSIS — J209 Acute bronchitis, unspecified: Secondary | ICD-10-CM

## 2022-08-26 DIAGNOSIS — J22 Unspecified acute lower respiratory infection: Secondary | ICD-10-CM

## 2022-08-26 MED ORDER — FLUCONAZOLE 150 MG PO TABS: 150.0000 mg | ORAL_TABLET | Freq: Once | ORAL | 0 refills | Status: AC

## 2022-08-26 MED ORDER — AZITHROMYCIN 250 MG PO TABS: ORAL_TABLET | ORAL | 0 refills | Status: DC

## 2022-08-26 MED ORDER — PREDNISONE 20 MG PO TABS: ORAL_TABLET | ORAL | 0 refills | Status: AC

## 2022-08-26 MED ORDER — BENZONATATE 100 MG PO CAPS: ORAL_CAPSULE | ORAL | 0 refills | Status: AC

## 2022-08-26 NOTE — Discharge Instructions (Signed)
Follow up here or with your primary care provider if your symptoms are worsening or not improving with treatment.     

## 2022-08-26 NOTE — ED Triage Notes (Signed)
Pt. Presents to UC w/ c/o a cough for the past 8 weeks.

## 2022-08-26 NOTE — ED Provider Notes (Signed)
[facility]    CSN: [id number] Arrival date & time: 08/26/22  1734      History   Chief Complaint Chief Complaint  Patient presents with   Cough    had cough for 9 weeks, very dry, got sick again over the weekend cough is worse not covid done 2 tests in 3 days - Entered by patient    HPI Diane Holt is a 51 y.o. female.    Cough   Presents to urgent care with complaint of cough x 8 weeks.  Patient states she was sick with an upper respiratory infection with cough.  She recovered from most symptoms except for lingering cough.  She states cough acutely worsened last weekend.  She states she is very sensitive to prednisone and it causes GI irritation.  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      Home Medications    Prior to Admission medications   Medication Sig  Start Date End Date Taking? Authorizing Provider  albuterol (VENTOLIN HFA) 108 (90 Base) MCG/ACT inhaler INHALE 2 PUFFS INTO THE LUNGS EVERY 6 HOURS AS NEEDED FOR WHEEZE 03/17/22  Yes [provider]  benzonatate (TESSALON) 200 MG capsule SMARTSIG:1 Capsule(s) By Mouth 2-3 Times Daily PRN 07/06/22  Yes [provider]  diazepam (VALIUM) 5 MG tablet TAKE 1 TABLET (5 MG TOTAL) BY MOUTH ONCE AS NEEDED FOR UP TO 1 DOSE (1 HR BEFORE MRI). 12/05/21  Yes [provider]  fluconazole (DIFLUCAN) 150 MG tablet TAKE 1 TABLET NOW AND 1 TABLET 3 DAYS LATER. 03/05/22  Yes [provider]  predniSONE (DELTASONE) 10 MG tablet Take 10 mg by mouth 2 (two) times daily. 07/06/22  Yes [provider]  ALPRAZolam Duanne Moron) 0.5 MG tablet Take 1 tablet (0.5 mg total) by mouth as needed for anxiety (for sedation before MRI scan; take 1 hour before scan; may repeat 15 min before scan). 07/13/18   Penumalli, Earlean Polka, MD  cephALEXin (KEFLEX) 500 MG capsule TAKE 1 CAPSULE BY MOUTH 3 TIMES DAILY FOR 7 DAYS.    [provider]  doxycycline (VIBRAMYCIN) 100 MG capsule TAKE 1 CAPSULE BY MOUTH TWICE A DAY FOR 10 DAYS    [provider]  fluticasone (FLONASE) 50 MCG/ACT nasal spray Place into the nose.    [provider]  HYDROcodone-acetaminophen (NORCO/VICODIN) 5-325 MG  per tablet Take 1 tablet by mouth every 6 (six) hours as needed. 04/22/14   Varney Biles, MD  ibuprofen (ADVIL,MOTRIN) 200 MG tablet Take 200 mg by mouth every 6 (six) hours as needed.    [provider]  levonorgestrel (MIRENA) 20 MCG/24HR IUD by Intrauterine route.    [provider]  loratadine (CLARITIN) 10 MG tablet Take 10 mg by mouth at bedtime as needed for allergies.    [provider]  Na Sulfate-K Sulfate-Mg Sulf (SUPREP BOWEL PREP KIT) 17.5-3.13-1.6 GM/177ML SOLN Take 1 kit by mouth as directed. 02/12/21   Lucilla Lame, MD  nitrofurantoin, macrocrystal-monohydrate,  (MACROBID) 100 MG capsule TAKE 1 CAPSULE BY MOUTH TWICE A DAY FOR 7 DAYS    [provider]  omeprazole (PRILOSEC) 20 MG capsule Take 1 capsule by mouth at bedtime as needed (acid reflux).  02/11/14   [provider]  tamsulosin (FLOMAX) 0.4 MG CAPS capsule TAKE 1 CAPSULE BY MOUTH DAILY FOR 14 DAYS.    [provider]    Family History Family History  Problem Relation Age of Onset   Osteoarthritis Mother    Heart disease Maternal Grandfather    Diabetes Paternal Grandmother    Heart disease Paternal Grandfather     Social History Social History   Tobacco Use   Smoking status: Never   Smokeless tobacco: Never  Substance Use Topics   Alcohol use: Yes    Comment: 2-4 monthly   Drug use: No     Allergies   Carisoprodol   Review of Systems Review of Systems  Respiratory:  Positive for cough.      Physical Exam Triage Vital Signs ED Triage Vitals  Enc Vitals Group     BP 08/26/22 1806 134/87     Pulse Rate 08/26/22 1806 76     Resp 08/26/22 1806 18     Temp 08/26/22 1806 99.4 F (37.4 C)     Temp src --      SpO2 08/26/22 1806 96 %     Weight --      Height --      Head Circumference --      Peak Flow --      Pain Score 08/26/22 1804 0     Pain Loc --      Pain Edu? --      Excl. in [place]? --    No data found.  Updated Vital Signs BP 134/87 (BP Location: Left Arm)   Pulse 76   Temp 99.4 F (37.4 C)   Resp 18   LMP 08/12/2022 (Approximate)   SpO2 96%   Visual Acuity Right Eye Distance:   Left Eye Distance:   Bilateral Distance:    Right Eye Near:   Left Eye Near:    Bilateral Near:     Physical Exam Vitals reviewed.  Constitutional:      Appearance: Normal appearance.  Cardiovascular:     Rate and Rhythm: Normal rate and regular rhythm.     Pulses: Normal pulses.     Heart sounds: Normal heart sounds.  Pulmonary:     Effort: Pulmonary effort is normal.     Breath sounds: Normal breath sounds. No wheezing or  rhonchi.  Skin:    General: Skin is warm and dry.  Neurological:     General: No focal deficit present.     Mental Status: She is alert and oriented to person, place, and time.  Psychiatric:  Mood and Affect: Mood normal.        Behavior: Behavior normal.      UC Treatments / Results  Labs (all labs ordered are listed, but only abnormal results are displayed) Labs Reviewed - No data to display  EKG   Radiology No results found.  Procedures Procedures (including critical care time)  Medications Ordered in UC Medications - No data to display  Initial Impression / Assessment and Plan / UC Course  I have reviewed the triage vital signs and the nursing notes.  Pertinent labs & imaging results that were available during my care of the patient were reviewed by me and considered in my medical decision making (see chart for details).   Patient is afebrile here without recent antipyretics. Satting well on room air. Overall is well appearing, well hydrated, without respiratory distress. Pulmonary exam is unremarkable.  Lungs CTAB without wheezing.  Moderate cough is present.  Dry sounding.  Unclear whether her symptoms are bacterial secondary to her initial infection after lingering bronchitis, or new viral infection causing bronchitis.  I suspect the latter however will treat with azithromycin to cover possible bacterial infection.  She states she is willing to use prednisone and I recommended she also take omeprazole and Pepcid to protect her stomach.  Final Clinical Impressions(s) / UC Diagnoses   Final diagnoses:  None   Discharge Instructions   None    ED Prescriptions   None    PDMP not reviewed this encounter.   Rose Phi, [place] 08/26/22 1825

## 2022-12-14 ENCOUNTER — Other Ambulatory Visit: Payer: Self-pay | Admitting: Orthopaedic Surgery

## 2022-12-14 DIAGNOSIS — M5432 Sciatica, left side: Secondary | ICD-10-CM

## 2022-12-14 DIAGNOSIS — M5431 Sciatica, right side: Secondary | ICD-10-CM

## 2022-12-14 DIAGNOSIS — M5416 Radiculopathy, lumbar region: Secondary | ICD-10-CM

## 2022-12-24 ENCOUNTER — Encounter: Payer: Self-pay | Admitting: Orthopaedic Surgery

## 2023-01-02 ENCOUNTER — Ambulatory Visit
Admission: RE | Admit: 2023-01-02 | Discharge: 2023-01-02 | Disposition: A | Discharge status: Home / Self Care | Payer: BC Managed Care – PPO | Source: Ambulatory Visit | Attending: Orthopaedic Surgery | Admitting: Orthopaedic Surgery

## 2023-01-02 DIAGNOSIS — M5431 Sciatica, right side: Secondary | ICD-10-CM

## 2023-01-02 DIAGNOSIS — M5416 Radiculopathy, lumbar region: Secondary | ICD-10-CM

## 2023-05-29 ENCOUNTER — Other Ambulatory Visit: Payer: Self-pay

## 2023-05-29 ENCOUNTER — Ambulatory Visit
Admission: RE | Admit: 2023-05-29 | Discharge: 2023-05-29 | Disposition: A | Discharge status: Home / Self Care | Payer: BC Managed Care – PPO | Source: Ambulatory Visit

## 2023-05-29 VITALS — BP 112/77 | HR 84 | Temp 98.2°F | Resp 16

## 2023-05-29 DIAGNOSIS — J209 Acute bronchitis, unspecified: Secondary | ICD-10-CM | POA: Diagnosis not present

## 2023-05-29 MED ORDER — FLUCONAZOLE 150 MG PO TABS: 150.0000 mg | ORAL_TABLET | ORAL | 0 refills | Status: AC | PRN

## 2023-05-29 MED ORDER — AZITHROMYCIN 250 MG PO TABS: ORAL_TABLET | ORAL | 0 refills | Status: AC

## 2023-05-29 MED ORDER — PREDNISONE 20 MG PO TABS: 20.0000 mg | ORAL_TABLET | Freq: Every day | ORAL | 0 refills | Status: AC

## 2023-05-29 NOTE — ED Provider Notes (Signed)
[facility]    CSN: [id number] Arrival date & time: 05/29/23  1057      History   Chief Complaint Chief Complaint  Patient presents with   Cough    No fever no chills dry cough bronchitis - Entered by patient    HPI Diane Holt is a 52 y.o. female.  Patient presents here concerns of coughing x 1 month.  Patient endorses a history of allergies and has treated cough conservatively with albuterol inhaler.  She has also been taking Claritin consistently daily without sniffing and improvement. She reports last year experiencing similar course of symptoms that required two rounds of antibiotics before symptoms resolved.  Reports the cough is likely improved consistent and develops without any precipitant triggers.  She has not had fever.  Denies any wheezing or difficulty breathing.  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      Home Medications    Prior to Admission medications   Medication Sig Start Date End Date Taking? Authorizing Provider  albuterol (VENTOLIN HFA) 108 (90 Base) MCG/ACT inhaler INHALE 2 PUFFS INTO THE LUNGS EVERY 6 HOURS AS NEEDED FOR WHEEZE 03/17/22  Yes [provider]  azithromycin (ZITHROMAX) 250 MG tablet Take 2 tabs PO x 1 dose, then 1 tab PO QD x 4 days 05/29/23  Yes Bing Neighbors, NP  fluconazole (DIFLUCAN) 150 MG tablet Take 1 tablet (150 mg total) by mouth every three (3) days as needed. Repeat if needed 05/29/23  Yes Bing Neighbors, NP  fluticasone [facility]) 50 MCG/ACT nasal spray Place into the nose.   Yes [provider]  loratadine (CLARITIN) 10 MG tablet Take 10 mg by mouth at bedtime as needed for allergies.   Yes [provider]  predniSONE (DELTASONE) 20 MG tablet Take 1 tablet (20 mg total) by mouth daily with breakfast for 5 days. 05/29/23 06/03/23 Yes Bing Neighbors, NP  ALPRAZolam Prudy Feeler) 0.5 MG tablet Take 1 tablet (0.5 mg total) by mouth as needed for anxiety (for sedation before MRI scan; take 1 hour before scan; may repeat 15 min before scan). 07/13/18   Penumalli, Glenford Bayley, MD  benzonatate (TESSALON) 100 MG capsule Take 1-2 tablets 3 times a day as needed for cough 08/26/22   Immordino, Stephen, FNP  diazepam (VALIUM) 5 MG tablet TAKE 1 TABLET (5 MG  TOTAL) BY MOUTH ONCE AS NEEDED FOR UP TO 1 DOSE (1 HR BEFORE MRI). 12/05/21   [provider]  HYDROcodone-acetaminophen (NORCO/VICODIN) 5-325 MG per tablet Take 1 tablet by mouth every 6 (six) hours as needed. 04/22/14   Derwood Kaplan, MD  ibuprofen (ADVIL,MOTRIN) 200 MG tablet Take 200 mg by mouth every 6 (six) hours as needed.    [provider]  levonorgestrel (MIRENA) 20 MCG/24HR IUD by Intrauterine route.    [provider]  Na Sulfate-K Sulfate-Mg Sulf (SUPREP BOWEL PREP KIT) 17.5-3.13-1.6 GM/177ML SOLN Take 1 kit  by mouth as directed. 02/12/21   Midge Minium, MD  omeprazole (PRILOSEC) 20 MG capsule Take 1 capsule by mouth at bedtime as needed (acid reflux).  02/11/14   [provider]  tamsulosin (FLOMAX) 0.4 MG CAPS capsule TAKE 1 CAPSULE BY MOUTH DAILY FOR 14 DAYS.    [provider]    Family History Family History  Problem Relation Age of Onset   Osteoarthritis Mother    Heart disease Maternal Grandfather    Diabetes Paternal Grandmother    Heart disease Paternal Grandfather     Social History Social History   Tobacco Use   Smoking status: Never   Smokeless tobacco: Never  Substance Use Topics   Alcohol use: Yes    Comment: 2-4 monthly   Drug use: No     Allergies   Carisoprodol   Review of Systems Review of Systems Pertinent negatives listed in HPI   Physical Exam Triage Vital Signs ED Triage Vitals  Encounter Vitals Group     BP 05/29/23 1127 112/77     Systolic BP Percentile --      Diastolic BP Percentile --      Pulse Rate 05/29/23 1127 84     Resp 05/29/23 1127 16     Temp 05/29/23 1127 98.2 F (36.8 C)     Temp src --      SpO2 05/29/23 1127 96 %     Weight --      Height --      Head Circumference --      Peak Flow --      Pain Score 05/29/23 1119 0     Pain Loc --      Pain Education --      Exclude from Growth Chart --    No data found.  Updated Vital Signs BP 112/77   Pulse 84   Temp 98.2 F (36.8 C)   Resp 16   SpO2 96%   Visual Acuity Right Eye Distance:   Left Eye Distance:   Bilateral Distance:    Right Eye Near:   Left Eye Near:    Bilateral Near:     Physical Exam General Appearance:    Alert, cooperative, no distress  HENT:   ENT exam normal, no neck nodes or sinus tenderness  Eyes:    PERRL, conjunctiva/corneas clear, EOM's intact       Lungs:     Clear to auscultation bilaterally, respirations unlabored  Heart:    Regular rate and rhythm  Neurologic:   Awake, alert, oriented x 3. No apparent focal  neurological           defect.         UC Treatments / Results  Labs (all labs ordered are listed, but only abnormal results are displayed) Labs Reviewed - No data to display  EKG   Radiology No results found.  Procedures Procedures (including  critical care time)  Medications Ordered in UC Medications - No data to display  Initial Impression / Assessment and Plan / UC Course  I have reviewed the triage vital signs and the nursing notes.  Pertinent labs & imaging results that were available during my care of the patient were reviewed by me and considered in my medical decision making (see chart for details).    Acute bronchitis, uncomplicated treatment per discharge medication orders. Diflucan prescribed for yeast prophylaxis due to antibiotic treatment. Return precautions given if symptoms worsen or do not improve. Final Clinical Impressions(s) / UC Diagnoses   Final diagnoses:  Acute bronchitis, unspecified organism   Discharge Instructions   None    ED Prescriptions     Medication Sig Dispense Auth. Provider   azithromycin (ZITHROMAX) 250 MG tablet Take 2 tabs PO x 1 dose, then 1 tab PO QD x 4 days 6 tablet Bing Neighbors, NP   fluconazole (DIFLUCAN) 150 MG tablet Take 1 tablet (150 mg total) by mouth every three (3) days as needed. Repeat if needed 2 tablet Bing Neighbors, NP   predniSONE (DELTASONE) 20 MG tablet Take 1 tablet (20 mg total) by mouth daily with breakfast for 5 days. 5 tablet Bing Neighbors, NP      PDMP not reviewed this encounter.   Bing Neighbors, NP 05/29/23 1151

## 2023-05-29 NOTE — ED Triage Notes (Signed)
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 Pt has been taking inhaler BID, clairten . Pt reports she usually take a Z-pack.

## 2023-06-07 LAB — COLOGUARD: COLOGUARD: NEGATIVE

## 2023-08-22 ENCOUNTER — Ambulatory Visit
Admission: RE | Admit: 2023-08-22 | Discharge: 2023-08-22 | Disposition: A | Discharge status: Home / Self Care | Payer: BC Managed Care – PPO | Source: Ambulatory Visit | Attending: Internal Medicine | Admitting: Internal Medicine

## 2023-08-22 VITALS — BP 124/90 | HR 82 | Temp 98.5°F | Resp 16

## 2023-08-22 DIAGNOSIS — H6991 Unspecified Eustachian tube disorder, right ear: Secondary | ICD-10-CM | POA: Diagnosis not present

## 2023-08-22 MED ORDER — PSEUDOEPHEDRINE HCL 60 MG PO TABS: 60.0000 mg | ORAL_TABLET | Freq: Three times a day (TID) | ORAL | 0 refills | Status: AC | PRN

## 2023-08-22 MED ORDER — FLUTICASONE PROPIONATE 50 MCG/ACT NA SUSP: 2.0000 | Freq: Every day | NASAL | 12 refills | Status: AC

## 2023-08-22 MED ORDER — CETIRIZINE HCL 10 MG PO TABS: 10.0000 mg | ORAL_TABLET | Freq: Every day | ORAL | 0 refills | Status: AC

## 2023-08-22 NOTE — ED Provider Notes (Signed)
[facility]  Note:  This document was prepared using Conservation officer, historic buildings and may include unintentional dictation errors.  MRN: [id number] DOB: September 30, 1970  Subjective:   Diane Holt is a 52 y.o. female presenting for 1 day history of persistent intermittent right ear pain.  No fever, runny or stuffy nose, sore throat, dizziness, tinnitus, ear trauma.  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  No current facility-administered medications for this encounter.  Current Outpatient Medications:    atomoxetine (STRATTERA) 40 MG capsule, Take by mouth., Disp: , Rfl:    albuterol (VENTOLIN HFA) 108 (90 Base) MCG/ACT inhaler, INHALE 2 PUFFS INTO THE LUNGS EVERY 6 HOURS AS NEEDED FOR WHEEZE, Disp: , Rfl:    ALPRAZolam (XANAX) 0.5 MG tablet, Take 1 tablet (0.5 mg total) by mouth as needed for anxiety (for sedation before MRI scan; take 1 hour before scan; may repeat 15 min before scan)., Disp: 3 tablet, Rfl: 0   azithromycin (ZITHROMAX) 250 MG tablet, Take 2 tabs PO x 1 dose, then 1 tab PO QD x 4 days, Disp: 6 tablet, Rfl: 0   benzonatate (TESSALON) 100 MG capsule, Take 1-2 tablets 3 times a day as needed for cough, Disp: 30 capsule, Rfl: 0   diazepam (VALIUM) 5 MG tablet, TAKE 1 TABLET (5 MG TOTAL) BY MOUTH ONCE AS NEEDED FOR UP TO 1 DOSE (1 HR BEFORE MRI)., Disp: , Rfl:    fluconazole (DIFLUCAN) 150 MG tablet, Take 1 tablet (150 mg total) by mouth every three (3) days as needed. Repeat if needed, Disp: 2 tablet, Rfl: 0   fluticasone (FLONASE) 50 MCG/ACT nasal spray, Place into the nose., Disp: , Rfl:    HYDROcodone-acetaminophen (NORCO/VICODIN) 5-325 MG per tablet, Take 1 tablet by mouth every 6 (six) hours as needed., Disp: 15 tablet, Rfl: 0   ibuprofen (ADVIL,MOTRIN) 200 MG tablet, Take 200 mg by mouth every 6 (six) hours as needed., Disp: , Rfl:    levonorgestrel (MIRENA) 20 MCG/24HR  IUD, by Intrauterine route., Disp: , Rfl:    loratadine (CLARITIN) 10 MG tablet, Take 10 mg by mouth at bedtime as needed for allergies., Disp: , Rfl:    Na Sulfate-K Sulfate-Mg Sulf (SUPREP BOWEL PREP KIT) 17.5-3.13-1.6 GM/177ML SOLN, Take 1 kit by mouth as directed., Disp: 354 mL, Rfl: 0   omeprazole (PRILOSEC) 20 MG capsule, Take 1 capsule by mouth at bedtime as needed (acid reflux). , Disp: , Rfl:    tamsulosin (FLOMAX) 0.4 MG CAPS capsule, TAKE 1 CAPSULE BY MOUTH DAILY FOR 14 DAYS., Disp: , Rfl:    Allergies  Allergen Reactions   Carisoprodol Rash    Face turned red    Past Medical History:  Diagnosis Date   GERD (gastroesophageal reflux disease)    Hydronephrosis    Kidney stones      Past Surgical History:  Procedure Laterality Date   CESAREAN SECTION     HERNIA REPAIR     umbilical   ROTATOR CUFF REPAIR Right 2017    Family History  Problem Relation Age of Onset   Osteoarthritis Mother    Heart disease Maternal Grandfather    Diabetes Paternal Grandmother    Heart disease Paternal Grandfather     Social History   Tobacco Use   Smoking status: Never   Smokeless tobacco: Never  Vaping Use   Vaping status: Never Used  Substance Use  Topics   Alcohol use: Yes    Comment: occ   Drug use: No    ROS   Objective:   Vitals: BP (!) 124/90 (BP Location: Right Arm)   Pulse 82   Temp 98.5 F (36.9 C) (Oral)   Resp 16   SpO2 97%   Physical Exam Constitutional:      General: She is not in acute distress.    Appearance: Normal appearance. She is well-developed and normal weight. She is not ill-appearing, toxic-appearing or diaphoretic.  HENT:     Head: Normocephalic and atraumatic.     Right Ear: Tympanic membrane, ear canal and external ear normal. No drainage or tenderness. No middle ear effusion. There is no impacted cerumen. Tympanic membrane is not erythematous or bulging.     Left Ear: Tympanic membrane, ear canal and external ear normal. No drainage  or tenderness.  No middle ear effusion. There is no impacted cerumen. Tympanic membrane is not erythematous or bulging.     Nose: No congestion or rhinorrhea.     Mouth/Throat:     Mouth: Mucous membranes are moist. No oral lesions.     Pharynx: No pharyngeal swelling, oropharyngeal exudate, posterior oropharyngeal erythema or uvula swelling.     Tonsils: No tonsillar exudate or tonsillar abscesses.  Eyes:     General: No scleral icterus.       Right eye: No discharge.        Left eye: No discharge.     Extraocular Movements: Extraocular movements intact.     Right eye: Normal extraocular motion.     Left eye: Normal extraocular motion.     Conjunctiva/sclera: Conjunctivae normal.  Cardiovascular:     Rate and Rhythm: Normal rate.  Pulmonary:     Effort: Pulmonary effort is normal.  Musculoskeletal:     Cervical back: Normal range of motion and neck supple.  Lymphadenopathy:     Cervical: No cervical adenopathy.  Skin:    General: Skin is warm and dry.  Neurological:     General: No focal deficit present.     Mental Status: She is alert and oriented to person, place, and time.  Psychiatric:        Mood and Affect: Mood normal.        Behavior: Behavior normal.     Assessment and Plan :   PDMP not reviewed this encounter.  1. Eustachian tube dysfunction, right    Unremarkable ENT exam.  Will use conservative management for what I suspect is eustachian tube dysfunction.  Recommended starting Flonase, Zyrtec, Sudafed. Counseled patient on potential for adverse effects with medications prescribed/recommended today, ER and return-to-clinic precautions discussed, patient verbalized understanding.    Wallis Bamberg, PA-C 08/22/23 1021

## 2023-08-22 NOTE — ED Triage Notes (Signed)
Pt c/o right earache started yesterday-relieved by advil yesterday-NAD-steady gait
